# Patient Record
Sex: Male | Born: 1965 | Race: Black or African American | Hispanic: No | Marital: Single | Smoking: Current every day smoker
Health system: Southern US, Community
[De-identification: ages and names within clinical notes are randomized; demographics above are authoritative.]

## PROBLEM LIST (undated history)

## (undated) ENCOUNTER — Emergency Department (HOSPITAL_COMMUNITY): Payer: 59 | Source: Home / Self Care

## (undated) DIAGNOSIS — K759 Inflammatory liver disease, unspecified: Secondary | ICD-10-CM

## (undated) DIAGNOSIS — D649 Anemia, unspecified: Secondary | ICD-10-CM

## (undated) DIAGNOSIS — I10 Essential (primary) hypertension: Secondary | ICD-10-CM

## (undated) HISTORY — PX: OTHER SURGICAL HISTORY: SHX169

## (undated) HISTORY — PX: HERNIA REPAIR: SHX51

## (undated) HISTORY — DX: Anemia, unspecified: D64.9

## (undated) HISTORY — DX: Essential (primary) hypertension: I10

## (undated) HISTORY — DX: Inflammatory liver disease, unspecified: K75.9

## (undated) HISTORY — PX: APPENDECTOMY: SHX54

---

## 1999-01-23 ENCOUNTER — Emergency Department (HOSPITAL_COMMUNITY): Admission: EM | Admit: 1999-01-23 | Discharge: 1999-01-23 | Payer: Self-pay | Admitting: Emergency Medicine

## 2000-08-16 ENCOUNTER — Emergency Department (HOSPITAL_COMMUNITY): Admission: EM | Admit: 2000-08-16 | Discharge: 2000-08-16 | Payer: Self-pay | Admitting: Emergency Medicine

## 2013-04-24 ENCOUNTER — Observation Stay (HOSPITAL_COMMUNITY)
Admission: EM | Admit: 2013-04-24 | Discharge: 2013-04-25 | Disposition: A | Discharge status: Home / Self Care | Payer: Self-pay | Attending: Internal Medicine | Admitting: Internal Medicine

## 2013-04-24 ENCOUNTER — Encounter (HOSPITAL_COMMUNITY): Payer: Self-pay

## 2013-04-24 ENCOUNTER — Emergency Department (HOSPITAL_COMMUNITY): Payer: Self-pay

## 2013-04-24 DIAGNOSIS — R6889 Other general symptoms and signs: Secondary | ICD-10-CM

## 2013-04-24 DIAGNOSIS — R55 Syncope and collapse: Secondary | ICD-10-CM | POA: Insufficient documentation

## 2013-04-24 DIAGNOSIS — R7309 Other abnormal glucose: Secondary | ICD-10-CM | POA: Insufficient documentation

## 2013-04-24 DIAGNOSIS — R739 Hyperglycemia, unspecified: Secondary | ICD-10-CM | POA: Diagnosis present

## 2013-04-24 DIAGNOSIS — X30XXXA Exposure to excessive natural heat, initial encounter: Secondary | ICD-10-CM | POA: Insufficient documentation

## 2013-04-24 DIAGNOSIS — R51 Headache: Secondary | ICD-10-CM | POA: Insufficient documentation

## 2013-04-24 DIAGNOSIS — R0609 Other forms of dyspnea: Secondary | ICD-10-CM | POA: Insufficient documentation

## 2013-04-24 DIAGNOSIS — R0602 Shortness of breath: Secondary | ICD-10-CM | POA: Insufficient documentation

## 2013-04-24 DIAGNOSIS — Z720 Tobacco use: Secondary | ICD-10-CM | POA: Diagnosis present

## 2013-04-24 DIAGNOSIS — F17200 Nicotine dependence, unspecified, uncomplicated: Secondary | ICD-10-CM | POA: Insufficient documentation

## 2013-04-24 DIAGNOSIS — G4733 Obstructive sleep apnea (adult) (pediatric): Secondary | ICD-10-CM | POA: Diagnosis present

## 2013-04-24 DIAGNOSIS — Z6841 Body Mass Index (BMI) 40.0 and over, adult: Secondary | ICD-10-CM | POA: Insufficient documentation

## 2013-04-24 DIAGNOSIS — T678XXA Other effects of heat and light, initial encounter: Secondary | ICD-10-CM | POA: Insufficient documentation

## 2013-04-24 DIAGNOSIS — R0989 Other specified symptoms and signs involving the circulatory and respiratory systems: Secondary | ICD-10-CM | POA: Insufficient documentation

## 2013-04-24 DIAGNOSIS — Z23 Encounter for immunization: Secondary | ICD-10-CM | POA: Insufficient documentation

## 2013-04-24 DIAGNOSIS — R42 Dizziness and giddiness: Secondary | ICD-10-CM | POA: Insufficient documentation

## 2013-04-24 DIAGNOSIS — G4710 Hypersomnia, unspecified: Secondary | ICD-10-CM | POA: Insufficient documentation

## 2013-04-24 DIAGNOSIS — R079 Chest pain, unspecified: Principal | ICD-10-CM | POA: Diagnosis present

## 2013-04-24 DIAGNOSIS — E669 Obesity, unspecified: Secondary | ICD-10-CM | POA: Diagnosis present

## 2013-04-24 DIAGNOSIS — L83 Acanthosis nigricans: Secondary | ICD-10-CM | POA: Insufficient documentation

## 2013-04-24 DIAGNOSIS — R5381 Other malaise: Secondary | ICD-10-CM | POA: Insufficient documentation

## 2013-04-24 LAB — RAPID URINE DRUG SCREEN, HOSP PERFORMED: Benzodiazepines: NOT DETECTED

## 2013-04-24 LAB — COMPREHENSIVE METABOLIC PANEL
Albumin: 3.7 g/dL (ref 3.5–5.2)
BUN: 21 mg/dL (ref 6–23)
Chloride: 101 mEq/L (ref 96–112)
Creatinine, Ser: 1.25 mg/dL (ref 0.50–1.35)
GFR calc Af Amer: 77 mL/min — ABNORMAL LOW (ref >90)
Glucose, Bld: 127 mg/dL — ABNORMAL HIGH (ref 70–99)
Total Bilirubin: 0.3 mg/dL (ref 0.3–1.2)
Total Protein: 7.5 g/dL (ref 6.0–8.3)

## 2013-04-24 LAB — TSH: TSH: 0.875 u[IU]/mL (ref 0.350–4.500)

## 2013-04-24 LAB — CBC WITH DIFFERENTIAL/PLATELET
Basophils Relative: 1 % (ref 0–1)
Eosinophils Absolute: 0.1 10*3/uL (ref 0.0–0.7)
HCT: 39.2 % (ref 39.0–52.0)
Hemoglobin: 13.4 g/dL (ref 13.0–17.0)
MCH: 29.8 pg (ref 26.0–34.0)
MCHC: 34.2 g/dL (ref 30.0–36.0)
Monocytes Absolute: 0.4 10*3/uL (ref 0.1–1.0)
Monocytes Relative: 7 % (ref 3–12)

## 2013-04-24 LAB — TROPONIN I
Troponin I: <0.3 ng/mL (ref <0.30)
Troponin I: <0.3 ng/mL (ref <0.30)

## 2013-04-24 LAB — HEMOGLOBIN A1C
Hgb A1c MFr Bld: 5.8 % — ABNORMAL HIGH (ref <5.7)
Mean Plasma Glucose: 120 mg/dL — ABNORMAL HIGH (ref <117)

## 2013-04-24 LAB — CBC
HCT: 40.4 % (ref 39.0–52.0)
Hemoglobin: 13.5 g/dL (ref 13.0–17.0)
MCV: 88 fL (ref 78.0–100.0)
Platelets: 316 10*3/uL (ref 150–400)
RBC: 4.59 MIL/uL (ref 4.22–5.81)
WBC: 6.5 10*3/uL (ref 4.0–10.5)

## 2013-04-24 LAB — POCT I-STAT TROPONIN I: Troponin i, poc: 0.01 ng/mL (ref 0.00–0.08)

## 2013-04-24 LAB — CREATININE, SERUM: GFR calc Af Amer: 69 mL/min — ABNORMAL LOW (ref >90)

## 2013-04-24 MED ORDER — PANTOPRAZOLE SODIUM 40 MG PO TBEC
40.0000 mg | DELAYED_RELEASE_TABLET | Freq: Every day | ORAL | Status: DC
  Administered 2013-04-25: 40 mg via ORAL
  Filled 2013-04-24 (×3): qty 1

## 2013-04-24 MED ORDER — ACETAMINOPHEN 650 MG RE SUPP: 650.0000 mg | Freq: Four times a day (QID) | RECTAL | Status: DC | PRN

## 2013-04-24 MED ORDER — ASPIRIN 81 MG PO CHEW
324.0000 mg | CHEWABLE_TABLET | Freq: Once | ORAL | Status: AC
  Administered 2013-04-24: 324 mg via ORAL
  Filled 2013-04-24: qty 4

## 2013-04-24 MED ORDER — SODIUM CHLORIDE 0.9 % IV BOLUS (SEPSIS)
1000.0000 mL | Freq: Once | INTRAVENOUS | Status: AC
  Administered 2013-04-24: 1000 mL via INTRAVENOUS

## 2013-04-24 MED ORDER — SODIUM CHLORIDE 0.9 % IJ SOLN
3.0000 mL | Freq: Two times a day (BID) | INTRAMUSCULAR | Status: DC
  Administered 2013-04-24 – 2013-04-25 (×2): 3 mL via INTRAVENOUS

## 2013-04-24 MED ORDER — ACETAMINOPHEN 325 MG PO TABS
650.0000 mg | ORAL_TABLET | Freq: Four times a day (QID) | ORAL | Status: DC | PRN
  Administered 2013-04-25: 650 mg via ORAL
  Filled 2013-04-24: qty 2

## 2013-04-24 MED ORDER — MORPHINE SULFATE 4 MG/ML IJ SOLN
4.0000 mg | Freq: Once | INTRAMUSCULAR | Status: AC
  Administered 2013-04-24: 4 mg via INTRAVENOUS
  Filled 2013-04-24: qty 1

## 2013-04-24 MED ORDER — ENOXAPARIN SODIUM 40 MG/0.4ML ~~LOC~~ SOLN
40.0000 mg | SUBCUTANEOUS | Status: DC
  Administered 2013-04-24: 40 mg via SUBCUTANEOUS
  Filled 2013-04-24 (×2): qty 0.4

## 2013-04-24 MED ORDER — OXYCODONE HCL 5 MG PO TABS
5.0000 mg | ORAL_TABLET | ORAL | Status: DC | PRN
  Administered 2013-04-24: 5 mg via ORAL
  Filled 2013-04-24: qty 1

## 2013-04-24 MED ORDER — ASPIRIN 81 MG PO CHEW
81.0000 mg | CHEWABLE_TABLET | Freq: Every day | ORAL | Status: DC
  Administered 2013-04-25: 12:00:00 81 mg via ORAL
  Filled 2013-04-24: qty 1

## 2013-04-24 MED ORDER — SODIUM CHLORIDE 0.9 % IJ SOLN: 3.0000 mL | INTRAMUSCULAR | Status: DC | PRN

## 2013-04-24 MED ORDER — PNEUMOCOCCAL VAC POLYVALENT 25 MCG/0.5ML IJ INJ
0.5000 mL | INJECTION | INTRAMUSCULAR | Status: AC
  Administered 2013-04-25: 0.5 mL via INTRAMUSCULAR
  Filled 2013-04-24 (×2): qty 0.5

## 2013-04-24 MED ORDER — ONDANSETRON HCL 4 MG/2ML IJ SOLN: 4.0000 mg | Freq: Four times a day (QID) | INTRAMUSCULAR | Status: DC | PRN

## 2013-04-24 MED ORDER — ONDANSETRON HCL 4 MG PO TABS: 4.0000 mg | ORAL_TABLET | Freq: Four times a day (QID) | ORAL | Status: DC | PRN

## 2013-04-24 MED ORDER — SODIUM CHLORIDE 0.9 % IV SOLN: 250.0000 mL | INTRAVENOUS | Status: DC | PRN

## 2013-04-24 MED ORDER — SODIUM CHLORIDE 0.9 % IJ SOLN
3.0000 mL | Freq: Two times a day (BID) | INTRAMUSCULAR | Status: DC
  Administered 2013-04-24: 3 mL via INTRAVENOUS

## 2013-04-24 MED ORDER — MORPHINE SULFATE 4 MG/ML IJ SOLN
4.0000 mg | INTRAMUSCULAR | Status: DC | PRN
  Administered 2013-04-24: 4 mg via INTRAVENOUS
  Filled 2013-04-24: qty 1

## 2013-04-24 MED ORDER — INFLUENZA VAC SPLIT QUAD 0.5 ML IM SUSP
0.5000 mL | INTRAMUSCULAR | Status: AC
  Administered 2013-04-25: 0.5 mL via INTRAMUSCULAR
  Filled 2013-04-24 (×2): qty 0.5

## 2013-04-24 MED ORDER — ALUM & MAG HYDROXIDE-SIMETH 200-200-20 MG/5ML PO SUSP: 30.0000 mL | Freq: Four times a day (QID) | ORAL | Status: DC | PRN

## 2013-04-24 NOTE — ED Notes (Signed)
Pt began having symptoms 2 days ago. He thought his chest pain felt like heart burn then he began to feel dizzy and light headed. Pt began to feel shaky and have near syncopal episodes. Pt chest began to feel tight and has a severe head ache as well. Pt states his right leg feels hot.

## 2013-04-24 NOTE — ED Provider Notes (Signed)
TIME SEEN: 10:57 AM  CHIEF COMPLAINT: Chest pain  HPI: Patient is a 47 year old male with a history of tobacco use who presents the emergency department with intermittent chest pressure with shortness of breath, diaphoresis for the last several days. He reports his pain is worse with exertion. Over the past 2 days he has been very lightheaded and feels like he may pass out at any moment. He describes a near-syncopal event on the way here to the emergency department had to pull his car over and call EMS. Denies any nausea or vomiting. No history of PE or DVT. No lower extremity swelling, prolonged immobilization, recent fracture, surgery, trauma. Patient is also complaining of a headache behind his right eye that feels a sharp, stabbing pain. He has had similar headaches in the past for several years. No photophobia or phonophobia. No fever, neck pain or neck stiffness. No numbness, tingling or focal weakness. No thunderclap headache.  ROS: See HPI Constitutional: no fever  Eyes: no drainage  ENT: no runny nose   Cardiovascular:   chest pain  Resp:  SOB  GI: no vomiting GU: no dysuria Integumentary: no rash  Allergy: no hives  Musculoskeletal: no leg swelling  Neurological: no slurred speech ROS otherwise negative  PAST MEDICAL HISTORY/PAST SURGICAL HISTORY:  No past medical history on file.  MEDICATIONS:  Prior to Admission medications   Medication Sig Start Date End Date Taking? Authorizing Provider  Ibuprofen-Diphenhydramine HCl (ADVIL PM) 200-25 MG CAPS Take 2 capsules by mouth at bedtime as needed (headache).   Yes Historical Provider, MD    ALLERGIES:  No Known Allergies  SOCIAL HISTORY:  History  Substance Use Topics  . Smoking status: Not on file  . Smokeless tobacco: Not on file  . Alcohol Use: Not on file    FAMILY HISTORY: Mother with a history of CHF  EXAM: BP 161/85  Pulse 83  Temp(Src) 97.5 F (36.4 C) (Oral)  Resp 25  SpO2 98% CONSTITUTIONAL: Alert and  oriented and responds appropriately to questions. Well-appearing; well-nourished HEAD: Normocephalic EYES: Conjunctivae clear, PERRL ENT: normal nose; no rhinorrhea; moist mucous membranes; pharynx without lesions noted NECK: Supple, no meningismus, no LAD  CARD: RRR; S1 and S2 appreciated; no murmurs, no clicks, no rubs, no gallops RESP: Normal chest excursion without splinting or tachypnea; breath sounds clear and equal bilaterally; no wheezes, no rhonchi, no rales,  ABD/GI: Normal bowel sounds; non-distended; soft, non-tender, no rebound, no guarding BACK:  The back appears normal and is non-tender to palpation, there is no CVA tenderness EXT: Normal ROM in all joints; non-tender to palpation; no edema; normal capillary refill; no cyanosis    SKIN: Normal color for age and race; warm NEURO: Moves all extremities equally, no facial droop, no slurred speech, normal sensation PSYCH: The patient's mood and manner are appropriate. Grooming and personal hygiene are appropriate.  MEDICAL DECISION MAKING:  Patient with some risk factors for ACS with chest pain and shortness of breath that is worse with exertion. He also describes a near syncopal event today with this episode. He does not have a primary care physician or cardiologist. No prior history of stress test or cardiac catheterization. He is hemodynamically stable. Will give aspirin, obtain cardiac labs, chest x-ray. Have recommended admission for chest pain rule out and near syncopal workup.   Date: 04/24/2013 10:41 AM  Rate: 82  Rhythm: normal sinus rhythm  QRS Axis: normal  Intervals: normal  ST/T Wave abnormalities: normal  Conduction Disutrbances: none  Narrative Interpretation: T wave flattening in 1 and aVL; no old for comparison    ED PROGRESS:  Labs unremarkable. Troponin negative. Chest x-ray clear. Patient is not orthostatic. Will discuss with hospitalist for admission. Patient agrees with this plan.  Spoke with Dr. Darnelle Catalan for  admission for chest pain rule out and near syncopal workup.  Layla Maw Wallis Vancott, DO 04/24/13 1256

## 2013-04-24 NOTE — Progress Notes (Signed)
Utilization Review completed.  Iliyana Convey RN CM  

## 2013-04-24 NOTE — H&P (Signed)
[facility] History and Physical  Kyle Lucas ZOX:[id number] DOB: 1965-08-22 DOA: 04/24/2013  Referring physician: Rochele Raring, DO PCP: No primary provider on file.   Chief Complaint: Chest pain   History of Present Illness: Kyle Lucas is an 47 y.o. male with no significant PMH who presents with a chief complaint of chest pain and presyncope. Patient tells me that he has been feeling dizzy for about 2 weeks. The dizziness is associated with headache and feeling weak. He also reports 3 days of intermittent chest pain. He has had chest pain similar to what he is experiencing now, and in the past, he took Prilosec and it relieved the pain. The pain has been intermittent, rated 4/10 at its worst, lasting approximately 15-20 minutes at a time. Prilosec is not working to alleviate the pain, as he usually does. 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 He called a coworker to come take him home. The symptoms were not associated with any diaphoresis. Patient has had some shortness of breath but no cough. He tells me that he is "afraid to sleep "because he is afraid that he will stop breathing and then he wakes frequently in the night feeling like he has smothered. He has been fatigued and has had dyspnea on exertion over the past several months. His cardiac risk factors include male sex, family history of early cardiac death, and smoking history. His lipid status is not known.  Review of Systems: Constitutional: No fever, no chills;  Appetite normal; No weight loss, no weight gain, + fatigue.  HEENT: No blurry vision, no diplopia, no pharyngitis, no dysphagia CV: + pressure like chest pain, no palpitations.  Resp: + intermittent/progressive SOB, no cough, +PND, +OSA symptoms. GI: No nausea, no vomiting, no diarrhea, no melena, no hematochezia.  GU: No dysuria, no  hematuria. MSK: no myalgias, + knee arthralgias.  Neuro:  + headache, no focal neurological deficits, no history of seizures.  Psych: + depression, + anxiety.  Endo: + heat intolerance, no cold intolerance, no polyuria, no polydipsia  Skin: No rashes, no skin lesions.  Heme: No easy bruising.  Past Medical History No past medical history on file.   Past Surgical History Past Surgical History  Procedure Laterality Date  . Left  arm surgery      knife injury.   Marland Kitchen Appendectomy    . Hernia repair       Social History: History   Social History  . Marital Status: Single    Spouse Name: N/A    Number of Children: 3  . Years of Education: 12   Occupational History  . Cab driver    Social History Main Topics  . Smoking status: Current Every Day Smoker -- 0.50 packs/day for 20 years    Types: Cigarettes  . Smokeless tobacco: Not on file  . Alcohol Use: Yes     Comment: 1-2 drinks once a week.  . Drug Use: No  . Sexual Activity: Not on file   Other Topics Concern  . Not on file   Social History Narrative   Single.  Lives alone.      Family History:  Family History  Problem Relation Age of Onset  . Cancer Father   . Heart failure Mother     Allergies: Review of patient's allergies indicates no known allergies.  Meds:  Prior to Admission medications   Medication Sig Start Date End Date Taking? Authorizing Provider  Ibuprofen-Diphenhydramine HCl (ADVIL PM) 200-25 MG CAPS Take 2 capsules by mouth at bedtime as needed (headache).   Yes Historical Provider, MD    Physical Exam: Filed Vitals:   04/24/13 1139 04/24/13 1141 04/24/13 1240 04/24/13 1300  BP: 158/79  117/62 117/69  Pulse: 85  76 67  Temp:  97.4 F (36.3 C)    TempSrc:  Oral    Resp:  20 22 21   SpO2:  97% 94% 95%     Physical Exam: Blood pressure 117/69, pulse 67, temperature 97.4 F (36.3 C), temperature source Oral, resp. rate 21, SpO2 95.00%. Gen: No acute distress. Obese. Head: Normocephalic,  atraumatic. Eyes: PERRL, EOMI, sclerae nonicteric. Mouth: Oropharynx Lucas. Mucous membranes moist. Neck: Supple, no thyromegaly, no lymphadenopathy, no jugular venous distention. Chest: Lungs Lucas to auscultation bilaterally. CV: Heart sounds are regular. No murmurs, rubs, or gallops. Abdomen: Soft, nontender, nondistended with normal active bowel sounds. Extremities: Extremities are with trace edema. Skin: Warm and dry. Acanthosis nigricans noted to the neck area. Neuro: Alert and oriented times 3; cranial nerves II through XII grossly intact. Psych: Mood and affect anxious.  Labs on Admission:  Basic Metabolic Panel:  Recent Labs Lab 04/24/13 1100  NA 135  K 3.8  CL 101  CO2 25  GLUCOSE 127*  BUN 21  CREATININE 1.25  CALCIUM 9.4   Liver Function Tests:  Recent Labs Lab 04/24/13 1100  AST 24  ALT 17  ALKPHOS 63  BILITOT 0.3  PROT 7.5  ALBUMIN 3.7   CBC:  Recent Labs Lab 04/24/13 1100  WBC 6.1  NEUTROABS 4.1  HGB 13.4  HCT 39.2  MCV 87.1  PLT 346   Radiological Exams on Admission: Dg Chest 2 View  04/24/2013   *RADIOLOGY REPORT*  Clinical Data: Chest pain  CHEST - 2 VIEW  Comparison: None.  Findings: Lungs are Lucas.  Heart is upper normal in size with normal pulmonary vascularity.  No pneumothorax.  No adenopathy.  No bone lesions.  IMPRESSION: No edema or consolidation.   Original Report Authenticated By: Bretta Bang, M.D.    EKG: Independently reviewed. In NSR at 82 beats per minute. T wave flattening in 1 and aVL. No old EKGs for comparison.  Assessment/Plan Principal Problem:   Chest pain -Admit to telemetry. Cycle cardiac markers q. 6 hours x3 sets. -Given family history of early cardiac death from heart failure, will check two-dimensional echocardiogram. -Start aspirin therapy. -Further risk stratify with a fasting lipid panel. -Check urine drug screen. -Start on empiric Protonix given heartburn history. Active Problems:    Obesity -Dietitian consultation requested for weight loss.   Tobacco abuse -Tobacco cessation counseling per nursing staff.   Probable Obstructive sleep apnea with fatigue -Will need outpatient sleep study set up at discharge. -Will check two-dimensional echocardiogram to evaluate for pulmonary hypertension.   Heat intolerance -Check TSH.   Hyperglycemia -Check hemoglobin A1c.  Code Status: Full. Family Communication: No family at bedside. Disposition Plan: Home when stable.  Time spent: One hour.  Kyle Lucas [facility] Pager [phone]  If 7PM-7AM, please contact night-coverage [email or web address] Password [facility] 04/24/2013, 2:04 PM

## 2013-04-24 NOTE — ED Notes (Signed)
Attempted to give report to floor RN. Nurse is unavailable will call back.

## 2013-04-24 NOTE — ED Notes (Signed)
Report given to floor RN

## 2013-04-24 NOTE — Progress Notes (Signed)
[facility] provided pt with a list of primary care resources and a [facility] [employer] [facility] application.  °

## 2013-04-25 DIAGNOSIS — I517 Cardiomegaly: Secondary | ICD-10-CM

## 2013-04-25 LAB — LIPID PANEL
Cholesterol: 141 mg/dL (ref 0–200)
VLDL: 23 mg/dL (ref 0–40)

## 2013-04-25 NOTE — Discharge Summary (Signed)
Physician Discharge Summary  Qunicy Higinbotham AOZ:[id number] DOB: 18-Apr-1966 DOA: 04/24/2013  PCP: No primary provider on file.  Admit date: 04/24/2013 Discharge date: 04/25/2013  Time spent: 45 minutes  Recommendations for Outpatient Follow-up:  -Will be discharged home today. -Appointments have been made for him to follow up with a PCP and a sleep study.   Discharge Diagnoses:  Principal Problem:   Chest pain Active Problems:   Obesity   Tobacco abuse   Probable Obstructive sleep apnea with fatigue   Heat intolerance   Hyperglycemia   Discharge Condition: Stable and improved.  Filed Weights   04/24/13 1428  Weight: 117.8 kg (259 lb 11.2 oz)    History of present illness:  Kyle Lucas is an 47 y.o. male with no significant PMH who presents with a chief complaint of chest pain and presyncope. Patient tells me that he has been feeling dizzy for about 2 weeks. The dizziness is associated with headache and feeling weak. He also reports 3 days of intermittent chest pain. He has had chest pain similar to what he is experiencing now, and in the past, he took Prilosec and it relieved the pain. The pain has been intermittent, rated 4/10 at its worst, lasting approximately 15-20 minutes at a time. Prilosec is not working to alleviate the pain, as he usually does. 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 He called a coworker to come take him home. The symptoms were not associated with any diaphoresis. Patient has had some shortness of breath but no cough. He tells me that he is "afraid to sleep "because he is afraid that he will stop breathing and then he wakes frequently in the night feeling like he has smothered. He has been fatigued and has had dyspnea on exertion over the past several months. His cardiac risk factors include male sex, family history of early cardiac  death, and smoking history. His lipid status is not known. We were asked to admit him for further evaluation and management.   Hospital Course:  Chest Pain -Resolved. -Ruled out for ACS by negative troponins and EKG without acute ischemic abnormality. -ECHO:Study Conclusions  Left ventricle: The cavity size was normal. Wall thickness was increased in a pattern of mild LVH. Systolic function was normal. The estimated ejection fraction was in the range of 55% to 60%.  ?OSA -Believe this is his main issue. -Have made arrangements for an OP sleep study. -Appointment details in discharge instructions.  Rest of chronic medical conditions have been stable.     Procedures:  ECHO   Consultations:  None  Discharge Instructions  Discharge Orders   Future Appointments Provider Department Dept Phone   05/13/2013 2:00 PM Cleora Fleet, MD [facility] AND [facility] [phone]   06/01/2013 8:00 PM [facility] [phone]   Future Orders Complete By Expires   Discontinue IV  As directed    Increase activity slowly  As directed        Medication List         ADVIL PM 200-25 MG Caps  Generic drug:  Ibuprofen-Diphenhydramine HCl  Take 2 capsules by mouth at bedtime as needed (headache).       No Known Allergies     Follow-up Information   Follow up with Standley Dakins, MD. (  Appointment 05/13/13 at 2:00 PM.  Take pictured ID, Medications, and $20.00 co pay. )    Specialty:  Family Medicine   Contact information:   [address] [place] [address] [phone]        The results of significant diagnostics from this hospitalization (including imaging, microbiology, ancillary and laboratory) are listed below for reference.    Significant Diagnostic Studies: Dg Chest 2 View  04/24/2013   *RADIOLOGY REPORT*  Clinical Data: Chest pain  CHEST - 2 VIEW  Comparison: None.  Findings: Lungs are  clear.  Heart is upper normal in size with normal pulmonary vascularity.  No pneumothorax.  No adenopathy.  No bone lesions.  IMPRESSION: No edema or consolidation.   Original Report Authenticated By: Bretta Bang, M.D.    Microbiology: No results found for this or any previous visit (from the past 240 hour(s)).   Labs: Basic Metabolic Panel:  Recent Labs Lab 04/24/13 1100 04/24/13 1451  NA 135  --   K 3.8  --   CL 101  --   CO2 25  --   GLUCOSE 127*  --   BUN 21  --   CREATININE 1.25 1.37*  CALCIUM 9.4  --    Liver Function Tests:  Recent Labs Lab 04/24/13 1100  AST 24  ALT 17  ALKPHOS 63  BILITOT 0.3  PROT 7.5  ALBUMIN 3.7   No results found for this basename: LIPASE, AMYLASE,  in the last 168 hours No results found for this basename: AMMONIA,  in the last 168 hours CBC:  Recent Labs Lab 04/24/13 1100 04/24/13 1451  WBC 6.1 6.5  NEUTROABS 4.1  --   HGB 13.4 13.5  HCT 39.2 40.4  MCV 87.1 88.0  PLT 346 316   Cardiac Enzymes:  Recent Labs Lab 04/24/13 1451 04/24/13 2014 04/25/13 0220  TROPONINI <0.30 <0.30 <0.30   BNP: BNP (last 3 results)  Recent Labs  04/24/13 1410  PROBNP <5.0   CBG: No results found for this basename: GLUCAP,  in the last 168 hours     Signed:  Chaya Jan  [facility] Pager: [phone] 04/25/2013, 4:08 PM

## 2013-04-25 NOTE — Progress Notes (Signed)
CARE MANAGEMENT NOTE 04/25/2013  Patient:  Kyle Lucas, Kyle Lucas   Account Number:  [id number]  Date Initiated:  04/25/2013  Documentation initiated by:  Ezekiel Ina  Subjective/Objective Assessment:   pt admitted with cco chest pain     Action/Plan:   from home   Anticipated DC Date:  04/25/2013   Anticipated DC Plan:  HOME/SELF CARE      DC Planning Services  CM consult  Follow-up appt scheduled      Choice offered to / List presented to:             Status of service:  In process, will continue to follow Medicare Important Message given?  NA - LOS <3 / Initial given by admissions (If response is "NO", the following Medicare IM given date [place] will be blank) Date Medicare IM given:   Date Additional Medicare IM given:    Discharge Disposition:    Per UR Regulation:  Reviewed for med. necessity/level of care/duration of stay  If discussed at Long Length of Stay Meetings, dates discussed:    Comments:  04/25/13 MMcGibboney, RN, BSN Chart reviewed. Appt with [facility] for 9/30 at 2:00 PM made, Sleep Study appt. 10/19 at 8:00PM scheduled.  Pt is aware of these appointments.

## 2013-04-25 NOTE — Progress Notes (Signed)
  RD consulted for nutrition education regarding weight loss.  Body mass index is 41.94 kg/(m^2). Pt meets criteria for Morbid Obesity based on current BMI. Pt's current weight is 259 lbs; pt states he used to weigh 210 lbs and has gained weight due to decreased physical activity and increased intake of fast food.   RD provided "Weight Loss Tips" handout from the Academy of [employer] and Dietetics. Pt reports eating a lot of fast food, pizza, ice cream, and cereal and drinking soda and sweet tea all day long. Encouraged increased intake of fruits and vegetables; discussed ways to incorporate fruits and vegetables into pt's meals and snacks. Provided examples of ways to balance meals/snacks and encouraged intake of high-fiber, whole grain complex carbohydrates. Emphasized the importance of hydration with calorie-free beverages and limiting sugar-sweetened beverages. Reviewed high fat foods for pt to avoid and healthier alternative cooking methods to frying. Provide sample low-calorie healthy menus. Encouraged pt to discuss physical activity options with physician. Teach back method used.  Pt states he will drink water instead of soda/tea, switch to low fat milk, decrease his portion sizes when eating out, cook at home more, eat more fruit, and he will try to be more physically active.   Expect good compliance.  Current diet order is Regular, patient is consuming approximately 100% of meals at this time. Labs and medications reviewed. No further nutrition interventions warranted at this time. RD contact information provided. If additional nutrition issues arise, please re-consult RD.  Ian Malkin RD, LDN Inpatient Clinical Dietitian Pager: [phone] After Hours Pager: [phone]

## 2013-04-25 NOTE — Progress Notes (Signed)
*  PRELIMINARY RESULTS* Echocardiogram 2D Echocardiogram has been performed.  Kyle Lucas 04/25/2013, 1:21 PM 

## 2013-05-13 ENCOUNTER — Inpatient Hospital Stay: Payer: Self-pay | Admitting: Family Medicine

## 2013-06-01 ENCOUNTER — Ambulatory Visit (HOSPITAL_BASED_OUTPATIENT_CLINIC_OR_DEPARTMENT_OTHER): Payer: 59 | Attending: Internal Medicine

## 2013-06-01 VITALS — Ht 66.0 in | Wt 250.0 lb

## 2013-06-01 DIAGNOSIS — G4733 Obstructive sleep apnea (adult) (pediatric): Secondary | ICD-10-CM

## 2013-06-07 NOTE — Procedures (Signed)
NAME:  Kyle Lucas, Kyle Lucas                ACCOUNT NO.:  [id number]  MEDICAL RECORD NO.:  [id number]          PATIENT TYPE:  OUT  LOCATION:  SLEEP CENTER                 FACILITY:  [facility]  PHYSICIAN:  Falon Flinchum D. Maple Hudson, MD, FCCP, FACPDATE OF BIRTH:  09[birth year]  DATE OF STUDY:  06/01/2013                           NOCTURNAL POLYSOMNOGRAM  REFERRING PHYSICIAN:  Peggye Pitt, M.D.  INDICATION FOR STUDY:  Hypersomnia with sleep apnea.  EPWORTH SLEEPINESS SCORE:  6/24.  BMI 38.7.  Weight 240 pounds.  Height 66 inches, neck 18 inches.  MEDICATIONS:  Home medications are charted for review.  SLEEP ARCHITECTURE:  Total sleep time 337.5 minutes with sleep efficiency 93%.  Stage I was 9%, stage II 74.8%, stage III absent,  REM 16.1% of total sleep time.  Sleep latency 7 minutes, REM latency 100.5 minutes.  Awake after sleep onset 18 minutes, arousal index 5.3.  Bedtime Medication:  None.  RESPIRATORY DATA:  Apnea-hypopnea index (AHI) 16 per hour.  A total of 90 events was scored including 22 obstructive apneas and 68 hypopneas. Events were more common while supine.  REM AHI 34.1 per hour.  There were not enough early events to meet protocol criteria for split protocol CPAP titration.  OXYGEN DATA:  Moderately loud snoring with oxygen desaturation to a nadir of 74% and mean oxygen saturation through the study of 94% on room air.  CARDIAC DATA:  Normal sinus rhythm.  MOVEMENT-PARASOMNIA:  No significant movement disturbed.  No bathroom trips.  IMPRESSIONS-RECOMMENDATIONS: 1. Moderate obstructive sleep apnea/hypopnea syndrome, AHI 16 per hour     with events in all positions.  REM AHI 34.1 per hour.  Moderately     loud snoring with oxygen desaturation to a nadir of 74% and mean     oxygen saturation through the study of 94% on room air. 2. There were insufficient numbers of early events to permit     application of split protocol CPAP titration.  Consider return for     dedicated CPAP  titration study if appropriate.     [name], MD, Tonny Bollman, FACP Diplomate, American Board of Sleep Medicine    CDY/MEDQ  D:  06/07/2013 09:19:56  T:  06/07/2013 09:47:19  Job:  [id number]

## 2013-07-03 ENCOUNTER — Encounter: Payer: Self-pay | Admitting: Internal Medicine

## 2013-09-10 ENCOUNTER — Encounter (HOSPITAL_COMMUNITY): Payer: Self-pay | Admitting: Emergency Medicine

## 2013-09-10 ENCOUNTER — Emergency Department (HOSPITAL_COMMUNITY)
Admission: EM | Admit: 2013-09-10 | Discharge: 2013-09-10 | Disposition: A | Discharge status: Home / Self Care | Payer: 59 | Attending: Emergency Medicine | Admitting: Emergency Medicine

## 2013-09-10 DIAGNOSIS — R51 Headache: Secondary | ICD-10-CM | POA: Insufficient documentation

## 2013-09-10 DIAGNOSIS — H9209 Otalgia, unspecified ear: Secondary | ICD-10-CM | POA: Insufficient documentation

## 2013-09-10 DIAGNOSIS — F17200 Nicotine dependence, unspecified, uncomplicated: Secondary | ICD-10-CM | POA: Insufficient documentation

## 2013-09-10 DIAGNOSIS — H938X1 Other specified disorders of right ear: Secondary | ICD-10-CM

## 2013-09-10 DIAGNOSIS — J3489 Other specified disorders of nose and nasal sinuses: Secondary | ICD-10-CM | POA: Insufficient documentation

## 2013-09-10 MED ORDER — PSEUDOEPHEDRINE HCL 60 MG PO TABS: 60.0000 mg | ORAL_TABLET | Freq: Four times a day (QID) | ORAL | Status: DC | PRN

## 2013-09-10 MED ORDER — SALINE SPRAY 0.65 % NA SOLN: 1.0000 | NASAL | Status: DC | PRN

## 2013-09-10 NOTE — ED Provider Notes (Signed)
CSN: [id number]     Arrival date & time 09/10/13  1355 History  This chart was scribed for non-physician practitioner Noland Fordyce, PA-C working with Threasa Beards, MD by Ludger Nutting, ED Scribe. This patient was seen in room WTR5/WTR5 and the patient's care was started at 2:23 PM.    Chief Complaint  Patient presents with  . Ear Fullness    The history is provided by the patient. No language interpreter was used.    HPI Comments: Kyle Lucas is a 48 y.o. male who presents to the Emergency Department complaining of constant right ear fullness that began about 45 minutes ago. Pt reports yawning when he suddenly felt a pop to the right ear. He reports having URI symptoms a few days ago. He states he does have mild dull ache in right ear that is starting to cause a headache, rates 2/10 on pain scale. Has not tried any medication PTA. Denies sick contacts.   History reviewed. No pertinent past medical history. Past Surgical History  Procedure Laterality Date  . Left  arm surgery      knife injury.   Marland Kitchen Appendectomy    . Hernia repair     Family History  Problem Relation Age of Onset  . Cancer Father   . Heart failure Mother    History  Substance Use Topics  . Smoking status: Current Every Day Smoker -- 0.50 packs/day for 20 years    Types: Cigarettes  . Smokeless tobacco: Never Used  . Alcohol Use: Yes     Comment: 1-2 drinks once a week.    Review of Systems  HENT: Positive for congestion and rhinorrhea. Negative for ear discharge and ear pain.        Right ear fullness  All other systems reviewed and are negative.    Allergies  Review of patient's allergies indicates no known allergies.  Home Medications   Current Outpatient Rx  Name  Route  Sig  Dispense  Refill  . pseudoephedrine (SUDAFED) 60 MG tablet   Oral   Take 1 tablet (60 mg total) by mouth every 6 (six) hours as needed for congestion.   30 tablet   0   . sodium chloride (OCEAN) 0.65 % SOLN nasal  spray   Each Nare   Place 1 spray into both nostrils as needed for congestion.   1 Bottle   0    BP 156/89  Pulse 96  Temp(Src) 98.2 F (36.8 C) (Oral)  SpO2 100% Physical Exam  Nursing note and vitals reviewed. Constitutional: He is oriented to person, place, and time. He appears well-developed and well-nourished.  HENT:  Head: Normocephalic and atraumatic.  Right Ear: Tympanic membrane, external ear and ear canal normal.  Left Ear: Tympanic membrane, external ear and ear canal normal.  Nose: Mucosal edema present.  R TM: normal, no edema, erythema or perforation. Ear canal clear, no drainage, erythema, cerumen, or foreign body.   Cardiovascular: Normal rate.   Pulmonary/Chest: Effort normal. No respiratory distress.  Abdominal: He exhibits no distension.  Neurological: He is alert and oriented to person, place, and time.  Skin: Skin is warm and dry.  Psychiatric: He has a normal mood and affect.    ED Course  Procedures (including critical care time)  DIAGNOSTIC STUDIES: Oxygen Saturation is 100% on RA, normal by my interpretation.    COORDINATION OF CARE: 2:25 PM Discussed treatment plan with pt at bedside and pt agreed to plan.   Labs Review  Labs Reviewed - No data to display Imaging Review No results found.  EKG Interpretation   None       MDM   1. Sensation of fullness in right ear    Pt c/o fullness in right ear, hx of URI symptoms.  Exam: unremarkable. Normal TM, ear canal clear. Nasal mucosa-edematous.  Rx: sudafed and ocean nasal spray. Advised to f/u with wellness center. Return precautions provided. Pt verbalized understanding and agreement with tx plan.  I personally performed the services described in this documentation, which was scribed in my presence. The recorded information has been reviewed and is accurate.   Noland Fordyce, PA-C 09/10/13 1439

## 2013-09-10 NOTE — Progress Notes (Signed)
[facility] CL provided patient with a list of primary care resources and ACA information.

## 2013-09-10 NOTE — ED Notes (Addendum)
Pt states he yawned 30 minutes ago, which made his right ear "pop". Pt states his hearing is muffled now and that there is drainage sensation in his ear. Pt denies ringing in ears.

## 2013-09-10 NOTE — ED Provider Notes (Signed)
Medical screening examination/treatment/procedure(s) were performed by non-physician practitioner and as supervising physician I was immediately available for consultation/collaboration.  EKG Interpretation   None        Threasa Beards, MD 09/10/13 1444

## 2013-09-10 NOTE — Discharge Instructions (Signed)
On exam no evidence of ear infection, ear wax build up or damage to ear drum.  Fullness sensation in ear may be due to nasal congestion and recent cold. You may try decongestants as well as nasal spray to help clear sinuses and allow them to drain easier. Do NOT use Q-tip or similar objects in ear as this could cause damage to eardrum if inserted too far.  If decongestants and nasal sprays do not help relieve symptoms in 2-3 days, follow up with a primary care provider for further evaluation and treatment.

## 2014-02-10 ENCOUNTER — Emergency Department (HOSPITAL_COMMUNITY): Admission: EM | Admit: 2014-02-10 | Payer: 59 | Source: Home / Self Care

## 2014-08-04 ENCOUNTER — Encounter (HOSPITAL_COMMUNITY): Payer: Self-pay

## 2014-08-04 ENCOUNTER — Emergency Department (HOSPITAL_COMMUNITY)
Admission: EM | Admit: 2014-08-04 | Discharge: 2014-08-04 | Disposition: A | Discharge status: Home / Self Care | Payer: 59 | Attending: Emergency Medicine | Admitting: Emergency Medicine

## 2014-08-04 DIAGNOSIS — R110 Nausea: Secondary | ICD-10-CM | POA: Insufficient documentation

## 2014-08-04 DIAGNOSIS — R634 Abnormal weight loss: Secondary | ICD-10-CM | POA: Insufficient documentation

## 2014-08-04 DIAGNOSIS — R6883 Chills (without fever): Secondary | ICD-10-CM | POA: Insufficient documentation

## 2014-08-04 DIAGNOSIS — R1013 Epigastric pain: Secondary | ICD-10-CM | POA: Insufficient documentation

## 2014-08-04 DIAGNOSIS — Z9089 Acquired absence of other organs: Secondary | ICD-10-CM | POA: Insufficient documentation

## 2014-08-04 LAB — URINE MICROSCOPIC-ADD ON

## 2014-08-04 LAB — BASIC METABOLIC PANEL
ANION GAP: 8 (ref 5–15)
BUN: 26 mg/dL — ABNORMAL HIGH (ref 6–23)
CALCIUM: 9.4 mg/dL (ref 8.4–10.5)
CO2: 26 mmol/L (ref 19–32)
CREATININE: 1.51 mg/dL — AB (ref 0.50–1.35)
Chloride: 107 mEq/L (ref 96–112)
GFR calc Af Amer: 61 mL/min — ABNORMAL LOW (ref >90)
GFR, EST NON AFRICAN AMERICAN: 53 mL/min — AB (ref >90)
Glucose, Bld: 81 mg/dL (ref 70–99)
Potassium: 4.4 mmol/L (ref 3.5–5.1)
SODIUM: 141 mmol/L (ref 135–145)

## 2014-08-04 LAB — URINALYSIS, ROUTINE W REFLEX MICROSCOPIC
BILIRUBIN URINE: NEGATIVE
Glucose, UA: NEGATIVE mg/dL
KETONES UR: 40 mg/dL — AB
Leukocytes, UA: NEGATIVE
NITRITE: NEGATIVE
Protein, ur: NEGATIVE mg/dL
SPECIFIC GRAVITY, URINE: 1.034 — AB (ref 1.005–1.030)
UROBILINOGEN UA: 1 mg/dL (ref 0.0–1.0)
pH: 5 (ref 5.0–8.0)

## 2014-08-04 LAB — CBC WITH DIFFERENTIAL/PLATELET
BASOS ABS: 0 10*3/uL (ref 0.0–0.1)
BASOS PCT: 0 % (ref 0–1)
EOS ABS: 0.1 10*3/uL (ref 0.0–0.7)
EOS PCT: 2 % (ref 0–5)
HEMATOCRIT: 38.5 % — AB (ref 39.0–52.0)
Hemoglobin: 12.3 g/dL — ABNORMAL LOW (ref 13.0–17.0)
LYMPHS PCT: 31 % (ref 12–46)
Lymphs Abs: 1.9 10*3/uL (ref 0.7–4.0)
MCH: 29 pg (ref 26.0–34.0)
MCHC: 31.9 g/dL (ref 30.0–36.0)
MCV: 90.8 fL (ref 78.0–100.0)
MONO ABS: 0.4 10*3/uL (ref 0.1–1.0)
Monocytes Relative: 7 % (ref 3–12)
Neutro Abs: 3.6 10*3/uL (ref 1.7–7.7)
Neutrophils Relative %: 60 % (ref 43–77)
PLATELETS: 360 10*3/uL (ref 150–400)
RBC: 4.24 MIL/uL (ref 4.22–5.81)
RDW: 14.3 % (ref 11.5–15.5)
WBC: 6 10*3/uL (ref 4.0–10.5)

## 2014-08-04 LAB — LIPASE, BLOOD: LIPASE: 14 U/L (ref 11–59)

## 2014-08-04 MED ORDER — GI COCKTAIL ~~LOC~~
30.0000 mL | Freq: Once | ORAL | Status: AC
  Administered 2014-08-04: 30 mL via ORAL
  Filled 2014-08-04: qty 30

## 2014-08-04 MED ORDER — OMEPRAZOLE 20 MG PO CPDR: 20.0000 mg | DELAYED_RELEASE_CAPSULE | Freq: Every day | ORAL | Status: DC

## 2014-08-04 MED ORDER — FAMOTIDINE IN NACL 20-0.9 MG/50ML-% IV SOLN
20.0000 mg | Freq: Once | INTRAVENOUS | Status: AC
  Administered 2014-08-04: 20 mg via INTRAVENOUS
  Filled 2014-08-04: qty 50

## 2014-08-04 MED ORDER — ONDANSETRON HCL 4 MG/2ML IJ SOLN
4.0000 mg | INTRAMUSCULAR | Status: AC
  Administered 2014-08-04: 4 mg via INTRAVENOUS
  Filled 2014-08-04: qty 2

## 2014-08-04 MED ORDER — SODIUM CHLORIDE 0.9 % IV BOLUS (SEPSIS)
1000.0000 mL | Freq: Once | INTRAVENOUS | Status: AC
  Administered 2014-08-04: 1000 mL via INTRAVENOUS

## 2014-08-04 NOTE — Discharge Instructions (Signed)
Please follow the directions provided.  Use the resource guide to establish care with a primary care provider for further evaluation of your symptoms.  Take the omeprazole once a day.  If your upper abdominal pain continues you may take it twice a day.  Your labs today were reassuring that you do not have an infection, or diabetes. Your kidney labs were slightly higher than the last measurement; BUN: 26 and Creatinine: 1.51.  Discuss this with your primary care provider. Your primary care doctor can evaluate the cause of the other non-emergent symptoms.  Don't hesitate to return for any new, worsening or concerning symptoms.      SEEK IMMEDIATE MEDICAL CARE IF:  You have pain in your arms, neck, jaw, teeth, or back.  Your pain increases or changes in intensity or duration.  You develop nausea, vomiting, or sweating (diaphoresis).  You develop shortness of breath, or you faint.  Your vomit is green, yellow, black, or looks like coffee grounds or blood.  Your stool is red, bloody, or black. These symptoms could be signs of other problems, such as heart disease, gastric bleeding, or esophageal bleeding.   Emergency Department Resource Guide 1) Find a Doctor and Pay Out of Pocket Although you won't have to find out who is covered by your insurance plan, it is a good idea to ask around and get recommendations. You will then need to call the office and see if the doctor you have chosen will accept you as a new patient and what types of options they offer for patients who are self-pay. Some doctors offer discounts or will set up payment plans for their patients who do not have insurance, but you will need to ask so you aren't surprised when you get to your appointment.  2) Contact Your Local Health Department Not all health departments have doctors that can see patients for sick visits, but many do, so it is worth a call to see if yours does. If you don't know where your local health department is, you can  check in your phone book. The CDC also has a tool to help you locate your state's health department, and many state websites also have listings of all of their local health departments.  3) Find a [facility] If your illness is not likely to be very severe or complicated, you may want to try a walk in clinic. These are popping up all over the country in pharmacies, drugstores, and shopping centers. They're usually staffed by nurse practitioners or physician assistants that have been trained to treat common illnesses and complaints. They're usually fairly quick and inexpensive. However, if you have serious medical issues or chronic medical problems, these are probably not your best option.  No Primary Care Doctor: - Call Health Connect at  [phone] - they can help you locate a primary care doctor that  accepts your insurance, provides certain services, etc. - Physician Referral Service- [phone]  Chronic Pain Problems: Organization         Address  Phone   Notes  [facility]  [phone] Patients need to be referred by their primary care doctor.   Medication Assistance: Organization         Address  Phone   Notes  [facility] Medication [facility] [place] [address] [place], [place] [address] [phone] --Must be a resident of [facility] -- Must have NO insurance coverage whatsoever (no Medicaid/ Medicare, etc.) -- The pt.  MUST have a primary care doctor that directs their care regularly and follows them in the community   MedAssist  [phone]   [employer]  [phone]    Agencies that provide inexpensive medical care: Organization         Address  Phone   Notes  [place]  [phone]   [facility]    [phone]   [facility] [place], [place] [address] [phone]   [place] [address] [place] [phone]    Planned Parenthood    [phone]   [facility]    [phone]   [place] [address], [place] Phone:  [phone], Fax:  [phone] Hours of Operation:  9 am - 6 pm, M-F.  Also accepts Medicaid/Medicare and self-pay.  [facility] for [place], [address], [place] Phone: [phone], Fax: [phone]. Hours of Operation:  8:30 am - 5:30 pm, M-F.  Also accepts Medicaid and self-pay.  [facility] [address], [place] Phone: [phone]   [place], [place], [place] [phone], Ext. [phone] Mondays & Thursdays: 7-9 AM.  First 15 patients are seen on a first come, first serve basis.    [place] Providers:  Organization         Address  Phone   Notes  [facility] [address], [place] [phone] Also accepts self-pay patients.  [facility] [address], [place]  [phone]   [place], [address], [place] [phone]   [facility] [address], [place] [phone]   Lucianne Lei [address], [place]   [phone] Only accepts [place] Access [place] patients after they have their name applied to their card.   Self-Pay (no insurance) in [facility]:  Organization         Address  Phone   Notes  Sickle Cell Patients, [facility] [place] [phone]   [facility] [place] [phone]   [facility] [place]  [place], [place], [place] [phone]   [facility]/Dr. Osei-Bonsu  [address] [place] or [address], [place] [phone] Phone number for both [place] and [place] locations is the  same.  Urgent Medical and [facility] [address], [place] [phone]   [facility] [address] [place] or [address] [phone]   [facility] [address] [place] [phone], phone; [phone], fax Sees patients 1st and 3rd Saturday of every month.  Must not qualify for public or private insurance (i.e. Medicaid, Medicare, [place] Health Choice, Veterans' Benefits)  Household income should be no more than 200% of the poverty level The clinic cannot treat you if you are pregnant or think you are pregnant  Sexually transmitted diseases are not treated at the clinic.    Dental Care: Organization         Address  Phone  Notes  [facility] [address]  Mardene Speak [phone] Accepts children up to age 64 who are enrolled in Medicaid or [place]; pregnant women with a Medicaid card; and children who have applied for Medicaid or [place] Health Choice, but were declined, whose parents can pay a reduced fee at time of service.  [facility]  [address], [place] [phone] Accepts children up to age 85 who are enrolled in [place] or [place]; pregnant women with a Medicaid card; and children who have applied for Medicaid or [place] Health Choice, but were declined, whose parents can pay a reduced fee at time of service.  [facility] Adult Dental Access PROGRAM  [place] [phone] Patients are seen by appointment only. Walk-ins are not accepted. [place] will see patients 74 years of age and older. Monday - Tuesday (8am-5pm) Most Wednesdays (8:30-5pm) $30 per visit, cash only  [facility] Adult [facility] Access PROGRAM  [address], [facility] [phone] Patients are seen by appointment only. Walk-ins are not accepted. [place] will see patients  6 years of age and older. One Wednesday Evening (Monthly: Volunteer Based).  $30 per visit, cash only  [place]  [phone] for adults; Children under age 22, call Graduate Pediatric Dentistry at [phone]. Children aged 67-14, please call [phone] to request a pediatric application.  Dental services are provided in all areas of dental care including fillings, crowns and bridges, complete and partial dentures, implants, gum treatment, root canals, and extractions. Preventive care is also provided. Treatment is provided to both adults and children. Patients are selected via a lottery and there is often a waiting list.   [facility] [address], [place]  [phone] [email or web address]   [facility] [address] [place], [place] [phone], Ext. [phone] Second and Fourth Thursday of each month, opens at 6:30 AM; Clinic ends at 9 AM.  Patients are seen on a first-come first-served basis, and a limited number are seen during each clinic.   [facility]  [address] Hillard Danker [place], [place] [phone]   Eligibility Requirements You must have lived in [place], [place], or [place] counties for at least the last three months.   You cannot be eligible for state or federal sponsored [employer], including [employer], [place], or [employer].   You generally cannot be eligible for healthcare insurance through your employer.    How to apply: Eligibility screenings are held every Tuesday and Wednesday afternoon from 1:00 pm until 4:00 pm. You do not need an appointment for the interview!  [facility] [address] [place], [place]   [place]  [place] Department  [place]  [phone]    Behavioral Health Resources in the Community: Intensive Outpatient  Programs Organization         Address  Phone  Notes  [place] [place]. [address] [place], [place] [phone]   [facility] Outpatient [address] [place], [place]   ADS: Alcohol & Drug S[facility]cs [address] [place], [place]   [place] [address]  [place], [place] or [phone]   Substance Abuse Resources Organization         Address  Phone  Notes  Alcohol and Drug  Services  [place]  [phone]   [facility] [place]   Chinita Pester  [phone]   Residential & Outpatient Substance Abuse Program  [phone]   Psychological Services Organization         Address  Phone  Notes  [place]  [place]  [phone]   [place] [address], [place] or [phone]    Mobile Crisis Teams Organization         Address  Phone  Notes  [facility], Mobile Crisis Care Unit  [phone]   [facility] Psych[facility]  [address] [place], [place]   [place] [address] [place] [phone]    Self-Help/Support Groups Organization         Address  Phone             Notes  [place]. of [place] - variety of support groups  [place] Call for more information  Narcotics Anonymous (NA), [facility] [address], [employer] [place]  2 meetings at this location   Special educational needs teacher         Address  Phone  Notes  [facility]idential Treatment [place],    [place]  [phone]   [facility]  [address] [place] [phone], [place], [place]   [place], [place] [phone] Admissions: 8am-3pm M-F  Incentives Substance [place] [address]    [place], [place]  [phone]   [facility] [address] [place], [place], [place]   [facility] [address],  [place], [place]   [facility] Programs - Intensive Outpatient [address] Kristeen [address], [place], [place]   [facility] [address].,  [place], [place] [phone] or [phone]   [facility] Treatment Services (RTS) [address] [place], [place] Accepts Medicaid  [facility] [place] [address]  [place] [phone] Substance Abuse/Addiction Treatment   [facility] Organization         Address  Phone  Notes  [facility]  [phone]   Domenic Schwab, PhD [address] Arlis Porta [place], [place]   [phone] or [phone]   [place], [place] [phone]   [facility] [address], [place], [place] [phone] Insurance/Medicaid/sponsorship through [facility] and Families [address], [address]                                    [place], [place] [phone] [place] [address], [place] [phone]    Dr. Adele Schilder  [phone]   [facility] Dept. 1) [address], [place] 2) [place] 3)  [address], [place] [phone]  [phone]   [facility] [phone] or [phone] (After Hours)

## 2014-08-04 NOTE — ED Provider Notes (Signed)
CSN: [id number]     Arrival date & time 08/04/14  1925 History   First MD Initiated Contact with Patient 08/04/14 1937     Chief Complaint  Patient presents with  . Abdominal Pain  . Chills   (Consider location/radiation/quality/duration/timing/severity/associated sxs/prior Treatment) HPI  Kyle Lucas is a 48 yo male presenting with report of recurrent abd pain worse 4 days ago.  He reports a history of epigastric pain usually resolved with as needed omeprazole. He states 4 days ago he noticed some chills that resolved that same day.  He also has had some intermittent nausea then also.  He states in the last 3 weeks he has noticed his clothes are looser and his girlfriend reports he appears thinner but has not objectively weighed himself but feels like he has lost weight.  He also reports a decreased appetite in the last 4 days.  He denies any dysuria, vomiting, diarrhea, objective fevers, melena or hematachezia.  History reviewed. No pertinent past medical history. Past Surgical History  Procedure Laterality Date  . Left  arm surgery      knife injury.   Marland Kitchen Appendectomy    . Hernia repair     Family History  Problem Relation Age of Onset  . Cancer Father   . Heart failure Mother    History  Substance Use Topics  . Smoking status: Current Every Day Smoker -- 0.50 packs/day for 20 years    Types: Cigarettes  . Smokeless tobacco: Never Used  . Alcohol Use: Yes     Comment: 1-2 drinks once a week.    Review of Systems  Constitutional: Positive for chills. Negative for fever.  HENT: Negative for sore throat.   Eyes: Negative for visual disturbance.  Respiratory: Negative for cough and shortness of breath.   Cardiovascular: Negative for chest pain and leg swelling.  Gastrointestinal: Positive for nausea and abdominal pain. Negative for vomiting and diarrhea.  Genitourinary: Negative for dysuria.  Musculoskeletal: Negative for myalgias.  Skin: Negative for rash.  Neurological:  Negative for weakness, numbness and headaches.    Allergies  Review of patient's allergies indicates no known allergies.  Home Medications   Prior to Admission medications   Medication Sig Start Date End Date Taking? Authorizing Provider  pseudoephedrine (SUDAFED) 60 MG tablet Take 1 tablet (60 mg total) by mouth every 6 (six) hours as needed for congestion. 09/10/13   Noland Fordyce, PA-C  sodium chloride (OCEAN) 0.65 % SOLN nasal spray Place 1 spray into both nostrils as needed for congestion. 09/10/13   Noland Fordyce, PA-C   BP 136/82 mmHg  Pulse 95  Temp(Src) 97.9 F (36.6 C) (Oral)  Resp 18  SpO2 98% Physical Exam  Constitutional: He appears well-developed and well-nourished. No distress.  HENT:  Head: Normocephalic and atraumatic.  Mouth/Throat: Oropharynx is clear and moist. No oropharyngeal exudate.  Eyes: Conjunctivae are normal.  Neck: Neck supple. No thyromegaly present.  Cardiovascular: Normal rate, regular rhythm and intact distal pulses.   Pulmonary/Chest: Effort normal and breath sounds normal. No respiratory distress. He has no wheezes. He has no rales. He exhibits no tenderness.  Abdominal: Soft. He exhibits no distension and no mass. There is tenderness in the epigastric area. There is no rigidity, no rebound, no guarding, no CVA tenderness, no tenderness at McBurney's point and negative Murphy's sign.    Musculoskeletal: He exhibits no tenderness.  Lymphadenopathy:    He has no cervical adenopathy.  Neurological: He is alert.  Skin: Skin is  warm and dry. No rash noted. He is not diaphoretic.  Psychiatric: He has a normal mood and affect.  Nursing note and vitals reviewed.   ED Course  Procedures (including critical care time) Labs Review Labs Reviewed  CBC WITH DIFFERENTIAL - Abnormal; Notable for the following:    Hemoglobin 12.3 (*)    HCT 38.5 (*)    All other components within normal limits  BASIC METABOLIC PANEL - Abnormal; Notable for the  following:    BUN 26 (*)    Creatinine, Ser 1.51 (*)    GFR calc non Af Amer 53 (*)    GFR calc Af Amer 61 (*)    All other components within normal limits  URINALYSIS, ROUTINE W REFLEX MICROSCOPIC - Abnormal; Notable for the following:    Specific Gravity, Urine 1.034 (*)    Hgb urine dipstick TRACE (*)    Ketones, ur 40 (*)    All other components within normal limits  LIPASE, BLOOD  URINE MICROSCOPIC-ADD ON    Imaging Review No results found.   EKG Interpretation None      MDM   Final diagnoses:  Epigastric pain   48 yo with recurrent epigastric pain and various non-emergent complaints including subjective weight loss. Discussed case with Dr. Vanita Panda.  CBC, CMP, UA to evaluate for infection or diabetes.  IV NS bolus, IV famotidine and GI cocktail  Labs reviewed: WBC, Glucose without significant abnormality, however BUN and creatinine mildly elevated from last year.    Discussed results with pt, his pain is completely relieved. Resources provided to establish care with a PCP for follow-up of other complaints and to evaluate renal labs.  Pt is well-appearing, in no acute distress and vital signs are stable.  They appear safe to be discharged.  Return precautions provided.    Filed Vitals:   08/04/14 1932 08/04/14 2140  BP: 136/82 140/70  Pulse: 95 81  Temp: 97.9 F (36.6 C) 97.7 F (36.5 C)  TempSrc: Oral Oral  Resp: 18 18  SpO2: 98% 98%   Meds given in ED:  Medications  sodium chloride 0.9 % bolus 1,000 mL (0 mLs Intravenous Stopped 08/04/14 2117)  gi cocktail (Maalox,Lidocaine,Donnatal) (30 mLs Oral Given 08/04/14 2018)  ondansetron (ZOFRAN) injection 4 mg (4 mg Intravenous Given 08/04/14 2017)  famotidine (PEPCID) IVPB 20 mg (0 mg Intravenous Stopped 08/04/14 2047)    Discharge Medication List as of 08/04/2014 10:00 PM    START taking these medications   Details  !! omeprazole (PRILOSEC) 20 MG capsule Take 1 capsule (20 mg total) by mouth daily.,  Starting 08/04/2014, Until Discontinued, Print     !! - Potential duplicate medications found. Please discuss with provider.         Britt Bottom, NP 08/05/14 [place], MD 08/05/14 2245

## 2014-08-04 NOTE — ED Notes (Addendum)
Pt presents with c/o abdominal pain, chills, and weight loss. Pt reports that he has lost approx 30 pounds in the last 2 weeks. Pt reports nausea but denies any vomiting. Pt also has had some diarrhea. Ambulatory to triage, NAD.

## 2015-07-20 ENCOUNTER — Encounter (HOSPITAL_COMMUNITY): Payer: Self-pay | Admitting: Emergency Medicine

## 2015-07-20 ENCOUNTER — Emergency Department (HOSPITAL_COMMUNITY): Payer: 59

## 2015-07-20 ENCOUNTER — Emergency Department (HOSPITAL_COMMUNITY)
Admission: EM | Admit: 2015-07-20 | Discharge: 2015-07-20 | Disposition: A | Discharge status: Home / Self Care | Payer: 59 | Attending: Emergency Medicine | Admitting: Emergency Medicine

## 2015-07-20 DIAGNOSIS — Z79899 Other long term (current) drug therapy: Secondary | ICD-10-CM | POA: Insufficient documentation

## 2015-07-20 DIAGNOSIS — R51 Headache: Secondary | ICD-10-CM | POA: Insufficient documentation

## 2015-07-20 DIAGNOSIS — J159 Unspecified bacterial pneumonia: Secondary | ICD-10-CM | POA: Insufficient documentation

## 2015-07-20 DIAGNOSIS — J189 Pneumonia, unspecified organism: Secondary | ICD-10-CM

## 2015-07-20 DIAGNOSIS — F17210 Nicotine dependence, cigarettes, uncomplicated: Secondary | ICD-10-CM | POA: Insufficient documentation

## 2015-07-20 LAB — RAPID STREP SCREEN (MED CTR MEBANE ONLY): Streptococcus, Group A Screen (Direct): NEGATIVE

## 2015-07-20 MED ORDER — NAPROXEN 500 MG PO TABS: 500.0000 mg | ORAL_TABLET | Freq: Two times a day (BID) | ORAL | Status: DC

## 2015-07-20 MED ORDER — DOXYCYCLINE HYCLATE 100 MG PO TABS: 100.0000 mg | ORAL_TABLET | Freq: Two times a day (BID) | ORAL | Status: DC

## 2015-07-20 MED ORDER — BENZONATATE 100 MG PO CAPS: 100.0000 mg | ORAL_CAPSULE | Freq: Three times a day (TID) | ORAL | Status: DC

## 2015-07-20 MED ORDER — DOXYCYCLINE HYCLATE 100 MG PO TABS
100.0000 mg | ORAL_TABLET | Freq: Once | ORAL | Status: AC
  Administered 2015-07-20: 100 mg via ORAL
  Filled 2015-07-20: qty 1

## 2015-07-20 NOTE — ED Provider Notes (Signed)
CSN: [id number]     Arrival date & time 07/20/15  0622 History   First MD Initiated Contact with Patient 07/20/15 [phone]     Chief Complaint  Patient presents with  . URI     Patient is a 49 y.o. male presenting with URI. The history is provided by the patient.  URI Presenting symptoms: congestion, cough, fatigue, rhinorrhea and sore throat   Presenting symptoms: no fever (felt like it but has not measured a temp)   Severity:  Moderate Onset quality:  Gradual Duration:  9 days Timing:  Constant Chronicity:  New Relieved by:  Nothing Ineffective treatments:  OTC medications Associated symptoms: headaches and myalgias   Risk factors: no recent illness and no recent travel     History reviewed. No pertinent past medical history. Past Surgical History  Procedure Laterality Date  . Left  arm surgery      knife injury.   Marland Kitchen Appendectomy    . Hernia repair     Family History  Problem Relation Age of Onset  . Cancer Father   . Heart failure Mother    Social History  Substance Use Topics  . Smoking status: Current Every Day Smoker -- 0.50 packs/day for 20 years    Types: Cigarettes  . Smokeless tobacco: Never Used  . Alcohol Use: Yes     Comment: 1-2 drinks once a week.    Review of Systems  Constitutional: Positive for fatigue. Negative for fever (felt like it but has not measured a temp).  HENT: Positive for congestion, rhinorrhea and sore throat.   Respiratory: Positive for cough.   Musculoskeletal: Positive for myalgias.  Neurological: Positive for headaches.  All other systems reviewed and are negative.     Allergies  Review of patient's allergies indicates no known allergies.  Home Medications   Prior to Admission medications   Medication Sig Start Date End Date Taking? Authorizing Provider  benzonatate (TESSALON) 100 MG capsule Take 1 capsule (100 mg total) by mouth every 8 (eight) hours. 07/20/15   Dorie Rank, MD  doxycycline (VIBRA-TABS) 100 MG tablet Take 1  tablet (100 mg total) by mouth 2 (two) times daily. 07/20/15   Dorie Rank, MD  naproxen (NAPROSYN) 500 MG tablet Take 1 tablet (500 mg total) by mouth 2 (two) times daily with a meal. As needed for pain 07/20/15   Dorie Rank, MD  omeprazole (PRILOSEC) 20 MG capsule Take 20 mg by mouth daily as needed (indigestion).    Historical Provider, MD  omeprazole (PRILOSEC) 20 MG capsule Take 1 capsule (20 mg total) by mouth daily. 08/04/14   Britt Bottom, NP  pseudoephedrine (SUDAFED) 60 MG tablet Take 1 tablet (60 mg total) by mouth every 6 (six) hours as needed for congestion. 09/10/13   Noland Fordyce, PA-C  sodium chloride (OCEAN) 0.65 % SOLN nasal spray Place 1 spray into both nostrils as needed for congestion. 09/10/13   Noland Fordyce, PA-C   BP 129/82 mmHg  Pulse 103  Temp(Src) 97.9 F (36.6 C) (Oral)  Resp 18  Ht 5\' 6"  (1.676 m)  Wt 108.863 kg  BMI 38.76 kg/m2  SpO2 98% Physical Exam  Constitutional: He appears well-developed and well-nourished. No distress.  HENT:  Head: Normocephalic and atraumatic.  Right Ear: External ear normal.  Left Ear: External ear normal.  Eyes: Conjunctivae are normal. Right eye exhibits no discharge. Left eye exhibits no discharge. No scleral icterus.  Neck: Neck supple. No tracheal deviation present.  Cardiovascular: Normal rate,  regular rhythm and intact distal pulses.   Pulmonary/Chest: Effort normal and breath sounds normal. No stridor. No respiratory distress. He has no wheezes. He has no rales.  Abdominal: Soft. Bowel sounds are normal. He exhibits no distension. There is no tenderness. There is no rebound and no guarding.  Musculoskeletal: He exhibits no edema or tenderness.  Neurological: He is alert. He has normal strength. No cranial nerve deficit (no facial droop, extraocular movements intact, no slurred speech) or sensory deficit. He exhibits normal muscle tone. He displays no seizure activity. Coordination normal.  Skin: Skin is warm and dry. No  rash noted.  Psychiatric: He has a normal mood and affect.  Nursing note and vitals reviewed.   ED Course  Procedures (including critical care time) Labs Review Labs Reviewed  RAPID STREP SCREEN (NOT AT [facility])  CULTURE, GROUP A STREP    Imaging Review Dg Chest 2 View  07/20/2015  CLINICAL DATA:  49 year old male with increasing cough congestion shortness of Breath chest pain in weakness for 2 weeks with no improvement on over the counter medication. Initial encounter. EXAM: CHEST  2 VIEW COMPARISON:  04/24/2013. FINDINGS: Right upper lobe curvilinear collapse or consolidation with vague surrounding asymmetrically increased patchy opacity (arrows). No pleural effusion. Elsewhere lung parenchyma is stable. No pneumothorax or edema. Stable cardiomegaly and mediastinal contours. Visualized tracheal air column is within normal limits. No acute osseous abnormality identified. Negative visible bowel gas pattern. IMPRESSION: Right upper lobe bronchopneumonia/pneumonia. No pleural effusion. Followup PA and lateral chest X-ray is recommended in 3-4 weeks following trial of antibiotic therapy to ensure resolution and exclude underlying malignancy. Electronically Signed   By: Genevie Ann M.D.   On: 07/20/2015 07:39   I have personally reviewed and evaluated these images and lab results as part of my medical decision-making.   MDM   Final diagnoses:  CAP (community acquired pneumonia)    The patient presents to the emergency room with complaints of sore throat, coughing and congestion for the last 2 weeks. Patient strep test is negative however his chest x-ray does show a right-sided bronchopneumonia.  The patient otherwise is healthy without significant comorbidities. His oxygen saturation is normal. He is not febrile or any distress.  I will discharge him home on a course of antibiotics. Discussed outpatient follow-up and a need for follow-up chest x-ray in 3-4 weeks.    Dorie Rank, MD 07/20/15 [phone]

## 2015-07-20 NOTE — ED Notes (Signed)
Pt c/o cough, sore throat, headache, sinus drainage, fever, chills body aches x 2 weeks. Pt has been using OTC meds but now feels weak with decreased appetite

## 2015-07-20 NOTE — Discharge Instructions (Signed)

## 2015-07-22 LAB — CULTURE, GROUP A STREP: STREP A CULTURE: NEGATIVE

## 2018-09-24 NOTE — Congregational Nurse Program (Signed)
  Dept: [facility] Nurse Program Note  Date of Encounter: 09/18/2018  Past Medical History: No past medical history on file.  Encounter Details: CNP Questionnaire - 09/24/18 2342      Questionnaire   Patient Status  Not Applicable    Race  Black or African American    Location Patient Served At  Not Applicable    Insurance  Not Applicable    Uninsured  Uninsured (NEW 1x/quarter)    Food  No food insecurities    Housing/Utilities  No permanent housing    Transportation  No transportation needs    Interpersonal Safety  Yes, feel physically and emotionally safe where you currently live    Medication  No medication insecurities    Medical Provider  No    Referrals  Area Agency;[facility]/Care Connects;Primary Care Provider/Clinic    ED Visit Averted  Not Applicable    Life-Saving Intervention Made  Not Applicable     Initial visit today for this young man that presents with feeling dizzy at  Times started about 2 weeks ago ,o no medication ,smokes about 3-4 per day .was told several months ago blood pressure may be border line.works in [place] at  [employer] and will get insurance soon. (after 90 days on his job).Has no PCP referred ,given number to call for an appointment. Has been in prison ,mentioned briefly.  Counseled regarding high blood pressure and the need to get it under control ,states he amy go to an urgent care if he needs to nurse will follow up with checking blood pressure next week and follow up with appointment for PCP visit  Eye appointment will check on visit by vision Lucianne Lei and give date to client

## 2018-09-24 NOTE — Congregational Nurse Program (Signed)
  Dept: [facility] Nurse Program Note  Date of Encounter: 09/24/2018  Past Medical History: No past medical history on file.  Encounter Details: CNP Questionnaire - 09/24/18 2352      Questionnaire   Patient Status  Not Applicable    Race  Black or African American    Location Patient Served At  Not Applicable    Insurance  Not Applicable    Uninsured  Uninsured (Subsequent visits/quarter)    Food  No food insecurities    Housing/Utilities  No permanent housing    Transportation  No transportation needs    Interpersonal Safety  Yes, feel physically and emotionally safe where you currently live    Medication  No medication insecurities    Medical Provider  Yes    Referrals  Area Agency;Primary Care Provider/Clinic;[facility]    ED Visit Averted  Not Applicable    Life-Saving Intervention Made  Not Applicable     follow up visit with client to recheck blood pressure. Client followed up and made appointment to establish PCP. Appointment made for 10-07-2018 at [facility]. Praised client for his follow up . Blood pressure down today but still hypersentive .Referral made to see Dr Asencion Noble at [facility] on tomorrow for elevation of his blood pressure.Client given dates  for vision Lucianne Lei 12-06-2018 [employer] .Client is a smoker ,3-4 per day ,1 pack every 2 weeks . Follow up at referral visit, will monitor blood pressure

## 2018-10-07 ENCOUNTER — Ambulatory Visit: Payer: 59 | Admitting: Family Medicine

## 2018-10-08 ENCOUNTER — Telehealth: Payer: Self-pay

## 2018-10-08 NOTE — Telephone Encounter (Signed)
Nurse called client once she found out he had been discharged from [facility]. Client answered and informed nurse he was no longer at the [facility]. Ask if he kept his PCP appointment at the clinic on 2-24 he states no . He didn't call to cancel.Counseled regarding broken appointments. He started getting his insurance from his job and encouraged to reschedule or connect with some PCP. Counseled regarding the need to get baseline blood work and monitor blood pressure. Client still has referral papers . Will call and secure PCP  . States he will let nurse know when he follows up. Wished him well

## 2019-05-09 ENCOUNTER — Encounter: Payer: Self-pay | Admitting: Nurse Practitioner

## 2019-05-09 ENCOUNTER — Ambulatory Visit (INDEPENDENT_AMBULATORY_CARE_PROVIDER_SITE_OTHER): Payer: Self-pay | Admitting: Nurse Practitioner

## 2019-05-09 VITALS — BP 144/77 | HR 70 | Temp 97.4°F | Ht 66.0 in | Wt 236.0 lb

## 2019-05-09 DIAGNOSIS — R1013 Epigastric pain: Secondary | ICD-10-CM

## 2019-05-09 DIAGNOSIS — A048 Other specified bacterial intestinal infections: Secondary | ICD-10-CM

## 2019-05-09 DIAGNOSIS — D649 Anemia, unspecified: Secondary | ICD-10-CM

## 2019-05-09 DIAGNOSIS — G8929 Other chronic pain: Secondary | ICD-10-CM

## 2019-05-09 MED ORDER — NA SULFATE-K SULFATE-MG SULF 17.5-3.13-1.6 GM/177ML PO SOLN: ORAL | 0 refills | Status: DC

## 2019-05-09 NOTE — Patient Instructions (Addendum)
If you are age 54 or older, your body mass index should be between 23-30. Your Body mass index is 38.09 kg/m. If this is out of the aforementioned range listed, please consider follow up with your Primary Care Provider.  If you are age 5 or younger, your body mass index should be between 19-25. Your Body mass index is 38.09 kg/m. If this is out of the aformentioned range listed, please consider follow up with your Primary Care Provider.   You have been scheduled for an endoscopy and colonoscopy. Please follow the written instructions given to you at your visit today. Please pick up your prep supplies at the pharmacy within the next 1-3 days. If you use inhalers (even only as needed), please bring them with you on the day of your procedure. Your physician has requested that you go to www.startemmi.com and enter the access code given to you at your visit today. This web site gives a general overview about your procedure. However, you should still follow specific instructions given to you by our office regarding your preparation for the procedure.  We have sent the following medications to your pharmacy for you to pick up at your convenience: [facility] provider has requested that you go to the basement level for lab work before leaving today. Press "B" on the elevator. The lab is located at the first door on the left as you exit the elevator.  Call on Monday with list of home medications.     Thank you for choosing me and [facility].   [name], NP

## 2019-05-09 NOTE — Progress Notes (Addendum)
ASSESSMENT / PLAN:   1. 53 yo male with chronic upper abdominal pain. In absence of weight loss or other concerning features after 15 years, doubt significant pathology. However, he does take NSAIDs, also H.pylori stool antigen positive. Currently on treatment for H.pylori and has noticed improvement in upper abdominal discomfort.  --Complete antibiotics for H.pylori.  --I assume patient is on a PPI but he doesn't know. He will call us with list of home medications.   ADDENDUM: Received records from Dr. Laural Benes on 06/09/19 For H. pylori patient was treated with clarithromycin, amoxicillin and Protonix  2. [place] anemia, hgb 12.3. No overt GI bleeding.  --Obtain ferritin, TIBC --For further evaluation of anemia patient will be scheduled for EGD and colonoscopy. The risks and benefits of EGD and colonoscopy with possible polypectomy / biopsies were discussed and the patient agrees to proceed.   HPI:    Referring Provider:    Geannie Risen, MD Reason for referral:    Abdominal pain and anemia  Chief Complaint:   Abdominal pain  Kyle Lucas is a 53 year old male with a PMH significant for HTN, obesity, HBV infection ( resolved)  Kyle Lucas gives a 15-year history of intermittent epigastric pain unrelated to eating or activity.  Episodes can last for hours.  No history of PUD, he has no associated nausea nor vomiting.  Reports normal bowel movements.  He does take NSAIDs in the form of ibuprofen and sometimes Goody powders.  He is currently on antibiotics for H. pylori infection detected by stool antigen.  Patient does not have a list of his home medications and doesn't know what he takes.  He has noted some improvement in the upper abdominal pain while on treatment for H. Pylori.  He has no lower GI complaints.  Bowel movements normal, no blood in stool.    Data Reviewed:  Lab core 04/24/2019 WBC 4.9, hemoglobin 12.3, hematocrit 35, platelets 349, MCV 85 CMET normal.  HBV surface  antibody qualitative -reactive HBV surface antigen-negative HCV antibody negative    Past Medical History:  Diagnosis Date  . Anemia   . Hepatitis   . Hypertension      Past Surgical History:  Procedure Laterality Date  . APPENDECTOMY    . HERNIA REPAIR    . left  arm surgery     knife injury.    Family History  Problem Relation Age of Onset  . Heart failure Mother   . Cancer Father    Social History   Tobacco Use  . Smoking status: Current Every Day Smoker    Packs/day: 0.50    Years: 20.00    Pack years: 10.00    Types: Cigarettes  . Smokeless tobacco: Never Used  Substance Use Topics  . Alcohol use: Yes    Comment: 1-2 drinks once a week.  . Drug use: No   No current outpatient medications on file.   No current facility-administered medications for this visit.    No Known Allergies   Review of Systems: All systems reviewed and negative except where noted in HPI.   Physical Exam:    Wt Readings from Last 3 Encounters:  05/09/19 236 lb (107 kg)  09/24/18 240 lb (108.9 kg)  07/20/15 240 lb (108.9 kg)    BP (!) 144/77   Pulse 70   Temp (!) 97.4 F (36.3 C)   Ht 5\' 6"  (1.676 m)   Wt  236 lb (107 kg)   SpO2 100%   BMI 38.09 kg/m  Constitutional:  Pleasant male in no acute distress. Psychiatric: Normal mood and affect. Behavior is normal. EENT: Pupils normal.  Conjunctivae are normal. No scleral icterus. Neck supple.  Cardiovascular: Normal rate, regular rhythm. No edema Pulmonary/chest: Effort normal and breath sounds normal. No wheezing, rales or rhonchi. Abdominal: Soft, nondistended, nontender. Bowel sounds active throughout. There are no masses palpable. No hepatomegaly. Neurological: Alert and oriented to person place and time. Skin: Skin is warm and dry. No rashes noted.  Kyle Savoy, NP  05/09/2019, 3:12 PM  Cc: Geannie Risen, MD

## 2019-05-12 ENCOUNTER — Telehealth: Payer: Self-pay | Admitting: Nurse Practitioner

## 2019-05-12 ENCOUNTER — Encounter: Payer: Self-pay | Admitting: Nurse Practitioner

## 2019-05-12 NOTE — Telephone Encounter (Signed)
Kyle Lucas did you need the medications the pt is taking.  He called with the list

## 2019-05-12 NOTE — Telephone Encounter (Signed)
If Kyle Lucas asked for the meds I would make sure she has them.  I guess send this to her.

## 2019-05-13 NOTE — Addendum Note (Signed)
Addended by: Lavena Bullion on: 05/13/2019 04:52 PM   Modules accepted: Level of Service

## 2019-05-13 NOTE — Telephone Encounter (Signed)
Ok, that is helpful. Thanks

## 2019-05-13 NOTE — Progress Notes (Signed)
Agree with the assessment and plan as outlined by Rubbie Battiest, NP. Hemoglobin 12.3 back in 2015.  I do not find any repeat in EMR.  Plan to repeat CBC at time of iron panel.  Otherwise, agree with plan for EGD for upper GI symptoms along with colonoscopy for history along with routine CRC screening.  Plan for additional work-up if iron panel demonstrates deficiency.  Kyle Wimberley, DO, [facility]

## 2019-05-26 ENCOUNTER — Encounter: Payer: Self-pay | Admitting: Gastroenterology

## 2019-05-28 ENCOUNTER — Telehealth: Payer: Self-pay

## 2019-05-28 NOTE — Telephone Encounter (Signed)
Covid-19 screening questions   Do you now or have you had a fever in the last 14 days? NO   Do you have any respiratory symptoms of shortness of breath or cough now or in the last 14 days? NO  Do you have any family members or close contacts with diagnosed or suspected Covid-19 in the past 14 days? NO  Have you been tested for Covid-19 and found to be positive? NO        

## 2019-05-29 ENCOUNTER — Encounter: Payer: Self-pay | Admitting: Gastroenterology

## 2019-05-29 ENCOUNTER — Other Ambulatory Visit: Payer: Self-pay

## 2019-05-29 ENCOUNTER — Ambulatory Visit (AMBULATORY_SURGERY_CENTER): Payer: BC Managed Care – PPO | Admitting: Gastroenterology

## 2019-05-29 VITALS — BP 146/94 | HR 70 | Temp 97.9°F | Resp 15 | Ht 66.0 in | Wt 236.0 lb

## 2019-05-29 DIAGNOSIS — K222 Esophageal obstruction: Secondary | ICD-10-CM | POA: Diagnosis not present

## 2019-05-29 DIAGNOSIS — K2980 Duodenitis without bleeding: Secondary | ICD-10-CM

## 2019-05-29 DIAGNOSIS — Z1211 Encounter for screening for malignant neoplasm of colon: Secondary | ICD-10-CM

## 2019-05-29 DIAGNOSIS — D123 Benign neoplasm of transverse colon: Secondary | ICD-10-CM | POA: Diagnosis not present

## 2019-05-29 DIAGNOSIS — D128 Benign neoplasm of rectum: Secondary | ICD-10-CM

## 2019-05-29 DIAGNOSIS — K621 Rectal polyp: Secondary | ICD-10-CM | POA: Diagnosis not present

## 2019-05-29 DIAGNOSIS — D125 Benign neoplasm of sigmoid colon: Secondary | ICD-10-CM

## 2019-05-29 DIAGNOSIS — K635 Polyp of colon: Secondary | ICD-10-CM

## 2019-05-29 DIAGNOSIS — K2950 Unspecified chronic gastritis without bleeding: Secondary | ICD-10-CM | POA: Diagnosis not present

## 2019-05-29 DIAGNOSIS — D649 Anemia, unspecified: Secondary | ICD-10-CM

## 2019-05-29 MED ORDER — PANTOPRAZOLE SODIUM 40 MG PO TBEC: 40.0000 mg | DELAYED_RELEASE_TABLET | Freq: Two times a day (BID) | ORAL | 0 refills | Status: DC

## 2019-05-29 MED ORDER — SODIUM CHLORIDE 0.9 % IV SOLN: 500.0000 mL | Freq: Once | INTRAVENOUS | Status: DC

## 2019-05-29 NOTE — Patient Instructions (Signed)
Please read handouts provided. Use Protonix  ( pantoprazole ) 40 mg twice daily for 8 weeks. Stop Ibuprofen, naproxen, or other non-steriodal anti-inflammatory drugs. Repeat upper endoscopy in 8 weeks to check healing. Obtain iron panel as previously ordered. Await pathology results. Continue present medications.      YOU HAD AN ENDOSCOPIC PROCEDURE TODAY AT [facility]:   Refer to the procedure report that was given to you for any specific questions about what was found during the examination.  If the procedure report does not answer your questions, please call your gastroenterologist to clarify.  If you requested that your care partner not be given the details of your procedure findings, then the procedure report has been included in a sealed envelope for you to review at your convenience later.  YOU SHOULD EXPECT: Some feelings of bloating in the abdomen. Passage of more gas than usual.  Walking can help get rid of the air that was put into your GI tract during the procedure and reduce the bloating. If you had a lower endoscopy (such as a colonoscopy or flexible sigmoidoscopy) you may notice spotting of blood in your stool or on the toilet paper. If you underwent a bowel prep for your procedure, you may not have a normal bowel movement for a few days.  Please Note:  You might notice some irritation and congestion in your nose or some drainage.  This is from the oxygen used during your procedure.  There is no need for concern and it should clear up in a day or so.  SYMPTOMS TO REPORT IMMEDIATELY:   Following lower endoscopy (colonoscopy or flexible sigmoidoscopy):  Excessive amounts of blood in the stool  Significant tenderness or worsening of abdominal pains  Swelling of the abdomen that is new, acute  Fever of 100F or higher   Following upper endoscopy (EGD)  Vomiting of blood or coffee ground material  New chest pain or pain under the shoulder blades  Painful or  persistently difficult swallowing  New shortness of breath  Fever of 100F or higher  Black, tarry-looking stools  For urgent or emergent issues, a gastroenterologist can be reached at any hour by calling [phone].   DIET:  We do recommend a small meal at first, but then you may proceed to your regular diet.  Drink plenty of fluids but you should avoid alcoholic beverages for 24 hours.  ACTIVITY:  You should plan to take it easy for the rest of today and you should NOT DRIVE or use heavy machinery until tomorrow (because of the sedation medicines used during the test).    FOLLOW UP: Our staff will call the number listed on your records 48-72 hours following your procedure to check on you and address any questions or concerns that you may have regarding the information given to you following your procedure. If we do not reach you, we will leave a message.  We will attempt to reach you two times.  During this call, we will ask if you have developed any symptoms of COVID 19. If you develop any symptoms (ie: fever, flu-like symptoms, shortness of breath, cough etc.) before then, please call [phone].  If you test positive for Covid 19 in the 2 weeks post procedure, please call and report this information to [place].    If any biopsies were taken you will be contacted by phone or by letter within the next 1-3 weeks.  Please call us at [phone] if you have  not heard about the biopsies in 3 weeks.    SIGNATURES/CONFIDENTIALITY: You and/or your care partner have signed paperwork which will be entered into your electronic medical record.  These signatures attest to the fact that that the information above on your After Visit Summary has been reviewed and is understood.  Full responsibility of the confidentiality of this discharge information lies with you and/or your care-partner.

## 2019-05-29 NOTE — Op Note (Signed)
[facility] Patient Name: Kyle Lucas Procedure Date: 05/29/2019 1:32 PM MRN: [id number] Endoscopist: Gerrit Heck , MD Age: 53 Referring MD:  Date of Birth: 10-Mar-1966 Gender: Male Account #: [id number] Procedure:                Colonoscopy Indications:              Screening for colorectal malignant neoplasm, This                            is the patient's first colonoscopy. No lower GI                            symptoms and no known FHx of CRC. Medicines:                Monitored Anesthesia Care Procedure:                Pre-Anesthesia Assessment:                           - Prior to the procedure, a History and Physical                            was performed, and patient medications and                            allergies were reviewed. The patient's tolerance of                            previous anesthesia was also reviewed. The risks                            and benefits of the procedure and the sedation                            options and risks were discussed with the patient.                            All questions were answered, and informed consent                            was obtained. Prior Anticoagulants: The patient has                            taken no previous anticoagulant or antiplatelet                            agents. ASA Grade Assessment: II - A patient with                            mild systemic disease. After reviewing the risks                            and benefits, the patient was deemed in  satisfactory condition to undergo the procedure.                           After obtaining informed consent, the colonoscope                            was passed under direct vision. Throughout the                            procedure, the patient's blood pressure, pulse, and                            oxygen saturations were monitored continuously. The                            Colonoscope was introduced  through the anus and                            advanced to the the terminal ileum. The colonoscopy                            was performed without difficulty. The patient                            tolerated the procedure well. The quality of the                            bowel preparation was adequate. The terminal ileum,                            ileocecal valve, appendiceal orifice, and rectum                            were photographed. Scope In: 1:53:11 PM Scope Out: 2:15:32 PM Scope Withdrawal Time: 0 hours 19 minutes 40 seconds  Total Procedure Duration: 0 hours 22 minutes 21 seconds  Findings:                 The perianal and digital rectal examinations were                            normal.                           Two sessile polyps were found in the transverse                            colon. The polyps were 2 to 3 mm in size. These                            polyps were removed with a cold biopsy forceps.                            Resection and retrieval were complete. Estimated  blood loss was minimal.                           Two sessile polyps were found in the sigmoid colon.                            The polyps were 2 to 4 mm in size. These polyps                            were removed with a cold snare. Resection and                            retrieval were complete. Estimated blood loss was                            minimal.                           A few sessile polyps were found in the rectum and                            recto-sigmoid colon. The polyps were 1 to 4 mm in                            size. Several of these polyps were removed with a                            cold forceps and cold snare (1) for histologic                            representative evaluation. Resection and retrieval                            were complete. Estimated blood loss was minimal.                           Multiple small and  large-mouthed diverticula were                            found in the sigmoid colon.                           Non-bleeding internal hemorrhoids were found during                            retroflexion. The hemorrhoids were small.                           The terminal ileum appeared normal. Complications:            No immediate complications. Estimated Blood Loss:     Estimated blood loss was minimal. Impression:               - Two 2 to 3 mm polyps in the transverse colon,  removed with a cold biopsy forceps. Resected and                            retrieved.                           - Two 2 to 4 mm polyps in the sigmoid colon,                            removed with a cold snare. Resected and retrieved.                           - A few 1 to 4 mm polyps in the rectum and at the                            recto-sigmoid colon, removed with a cold snare.                            Resected and retrieved.                           - Diverticulosis in the sigmoid colon.                           - Non-bleeding internal hemorrhoids.                           - The examined portion of the ileum was normal. Recommendation:           - Patient has a contact number available for                            emergencies. The signs and symptoms of potential                            delayed complications were discussed with the                            patient. Return to normal activities tomorrow.                            Written discharge instructions were provided to the                            patient.                           - Resume previous diet.                           - Continue present medications.                           - Await pathology results.                           -  Repeat colonoscopy for surveillance based on                            pathology results.                           - Internal hemorrhoids were noted on this study and                             may be amenable to hemorrhoid band ligation. If you                            are interested in further treatment of these                            hemorrhoids with band ligation, please contact my                            clinic to set up an appointment for evaluation and                            treatment. Gerrit Heck, MD 05/29/2019 2:33:25 PM

## 2019-05-29 NOTE — Progress Notes (Signed)
Report to PACU, RN, vss, BBS= Clear.  

## 2019-05-29 NOTE — Progress Notes (Signed)
Called to room to assist during endoscopic procedure.  Patient ID and intended procedure confirmed with present staff. Received instructions for my participation in the procedure from the performing physician.  

## 2019-05-29 NOTE — Progress Notes (Signed)
Pt's states no medical or surgical changes since previsit or office visit.  JB - temp CW - vitals. 

## 2019-05-29 NOTE — Progress Notes (Signed)
Patient unable to schedule upper endoscopy today, wants to check his schedule and will call back to schedule,  B.Zavien Clubb RN.

## 2019-05-29 NOTE — Op Note (Signed)
[facility] Patient Name: Kyle Lucas Procedure Date: 05/29/2019 1:33 PM MRN: [id number] Endoscopist: Gerrit Heck , MD Age: 53 Referring MD:  Date of Birth: [birth year] Gender: Male Account #: [id number] Procedure:                Upper GI endoscopy Indications:              Epigastric abdominal pain, Follow-up of                            Helicobacter pylori, Anemia                           53 yo male with long-standing history of                            intermittent MEG pain. Recently diagnosed with H                            pylori, treated with triple therapy. Also with                            history of mild, normocytic anemia. Medicines:                Monitored Anesthesia Care Procedure:                Pre-Anesthesia Assessment:                           - Prior to the procedure, a History and Physical                            was performed, and patient medications and                            allergies were reviewed. The patient's tolerance of                            previous anesthesia was also reviewed. The risks                            and benefits of the procedure and the sedation                            options and risks were discussed with the patient.                            All questions were answered, and informed consent                            was obtained. Prior Anticoagulants: The patient has                            taken no previous anticoagulant or antiplatelet  agents. ASA Grade Assessment: II - A patient with                            mild systemic disease. After reviewing the risks                            and benefits, the patient was deemed in                            satisfactory condition to undergo the procedure.                           After obtaining informed consent, the endoscope was                            passed under direct vision. Throughout the                             procedure, the patient's blood pressure, pulse, and                            oxygen saturations were monitored continuously. The                            Endoscope was introduced through the mouth, and                            advanced to the third part of duodenum. The upper                            GI endoscopy was accomplished without difficulty.                            The patient tolerated the procedure well. Scope In: Scope Out: Findings:                 The examined esophagus was normal.                           The Z-line was regular and was found 40 cm from the                            incisors.                           Localized area of mildly nodular mucosa was found                            in the cardia. Biopsies were taken with a cold                            forceps for histology. Estimated blood loss was  minimal.                           Scattered moderate inflammation characterized by                            erosions, erythema and shallow ulcerations was                            found in the gastric body, at the incisura and in                            the gastric antrum. Biopsies were taken with a cold                            forceps for histology. Estimated blood loss was                            minimal.                           Localized moderate inflammation characterized by                            congestion (edema), erosions, erythema and shallow                            ulcerations was found in the duodenal bulb and in                            the first portion of the duodenum. Biopsies were                            taken with a cold forceps for histology. Estimated                            blood loss was minimal.                           An acquired benign-appearing, intrinsic very mild                            stenosis was found in the first portion of the                             duodenum and was easily traversed. Endoscopic                            appearance suspicious for NSAID diaphragm                            stricture. Biopsies were taken with a cold forceps                            for histology. Estimated  blood loss was minimal.                           The second portion of the duodenum and third                            portion of the duodenum were normal. Complications:            No immediate complications. Estimated Blood Loss:     Estimated blood loss was minimal. Impression:               - Normal esophagus.                           - Z-line regular, 40 cm from the incisors.                           - Nodular mucosa in the cardia. Biopsied.                           - Gastritis. Biopsied.                           - Duodenitis. Biopsied.                           - Acquired duodenal stenosis. Biopsied.                           - Normal second portion of the duodenum and third                            portion of the duodenum. Recommendation:           - Patient has a contact number available for                            emergencies. The signs and symptoms of potential                            delayed complications were discussed with the                            patient. Return to normal activities tomorrow.                            Written discharge instructions were provided to the                            patient.                           - Resume previous diet.                           - Use Protonix (pantoprazole) 40 mg PO BID for 8  weeks.                           - Stop ibuprofen, naproxen, or other non-steroidal                            anti-inflammatory drugs (NSAIDs).                           - Repeat upper endoscopy in 8 weeks to check                            healing.                           - Obtain iron panel as previously ordered. Gerrit Heck, MD 05/29/2019 2:28:18  PM

## 2019-06-02 ENCOUNTER — Telehealth: Payer: Self-pay | Admitting: *Deleted

## 2019-06-02 ENCOUNTER — Telehealth: Payer: Self-pay

## 2019-06-02 NOTE — Telephone Encounter (Signed)
  Follow up Call-  Call back number 05/29/2019  Post procedure Call Back phone  # [phone]  Permission to leave phone message Yes  Some recent data might be hidden     Patient questions:  Message left to call us if necessary.

## 2019-06-02 NOTE — Telephone Encounter (Signed)
   For urgent or emergent issues, a gastroenterologist can be reached at any hour by calling [phone].  1. Have you developed a fever since your procedure?no  2.   Have you had an respiratory symptoms (SOB or cough) since your procedure? no  3.   Have you tested positive for COVID 19 since your procedure no  4.   Have you had any family members/close contacts diagnosed with the COVID 19 since your procedure?  no   If yes to any of these questions please route to Joylene John, RN and Alphonsa Gin, Therapist, sports.   Follow up Call-  Call back number 05/29/2019  Post procedure Call Back phone  # [phone]  Permission to leave phone message Yes  Some recent data might be hidden     Patient questions:  Do you have a fever, pain , or abdominal swelling? No. Pain Score  0   Have you tolerated food without any problems? Yes.    Have you been able to return to your normal activities? Yes.    Do you have any questions about your discharge instructions: Diet   no Medications  No. Follow up visit  No.  Do you have questions or concerns about your Care? No.  Actions: * If pain score is 4 or above: No action needed, pain <4.

## 2019-06-02 NOTE — Telephone Encounter (Signed)
Pt stated that he is doing fine and no need for call back.

## 2019-06-05 ENCOUNTER — Encounter: Payer: Self-pay | Admitting: Gastroenterology

## 2019-06-18 ENCOUNTER — Encounter: Payer: Self-pay | Admitting: Gastroenterology

## 2019-08-28 ENCOUNTER — Ambulatory Visit
Admission: RE | Admit: 2019-08-28 | Discharge: 2019-08-28 | Disposition: A | Discharge status: Home / Self Care | Payer: Self-pay | Source: Ambulatory Visit | Attending: Obstetrics and Gynecology | Admitting: Obstetrics and Gynecology

## 2019-08-28 ENCOUNTER — Other Ambulatory Visit: Payer: Self-pay

## 2019-08-28 ENCOUNTER — Other Ambulatory Visit: Payer: Self-pay | Admitting: Obstetrics and Gynecology

## 2019-08-28 DIAGNOSIS — S6721XD Crushing injury of right hand, subsequent encounter: Secondary | ICD-10-CM

## 2020-01-18 ENCOUNTER — Encounter (HOSPITAL_COMMUNITY): Payer: Self-pay

## 2020-01-18 ENCOUNTER — Emergency Department (HOSPITAL_COMMUNITY)
Admission: EM | Admit: 2020-01-18 | Discharge: 2020-01-18 | Discharge status: Against Medical Advice | Payer: Self-pay | Attending: Emergency Medicine | Admitting: Emergency Medicine

## 2020-01-18 ENCOUNTER — Other Ambulatory Visit: Payer: Self-pay

## 2020-01-18 DIAGNOSIS — F17210 Nicotine dependence, cigarettes, uncomplicated: Secondary | ICD-10-CM | POA: Insufficient documentation

## 2020-01-18 DIAGNOSIS — G8921 Chronic pain due to trauma: Secondary | ICD-10-CM | POA: Insufficient documentation

## 2020-01-18 DIAGNOSIS — M79641 Pain in right hand: Secondary | ICD-10-CM

## 2020-01-18 DIAGNOSIS — Z5320 Procedure and treatment not carried out because of patient's decision for unspecified reasons: Secondary | ICD-10-CM | POA: Insufficient documentation

## 2020-01-18 NOTE — ED Provider Notes (Signed)
[facility] EMERGENCY DEPARTMENT Provider Note   CSN: [id number] Arrival date & time: 01/18/20  1116     History No chief complaint on file.   Barbara Keng is a 54 y.o. male.  HPI    Patient presents concern of ongoing right hand pain. Patient states that he is generally well, denies chronic issues. 1 month ago the patient injured his hand at work with accident.  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 He notes that 3 days later he had an x-ray which was reportedly normal. However, now he presents with concern for ongoing swelling in the dorsum of his right hand, inability to fully grasp in that area, and diffuse soreness. Transient relief with OTC medication, nothing sustained. No other complaints, no other injuries.  Past Medical History:  Diagnosis Date  . Anemia   . Hepatitis   . Hypertension     Patient Active Problem List   Diagnosis Date Noted  . Chest pain 04/24/2013  . Obesity 04/24/2013  . Tobacco abuse 04/24/2013  . Probable Obstructive sleep apnea with fatigue 04/24/2013  . Heat intolerance 04/24/2013  . Hyperglycemia 04/24/2013    Past Surgical History:  Procedure Laterality Date  . APPENDECTOMY    . HERNIA REPAIR    . left  arm surgery     knife injury.        Family History  Problem Relation Age of Onset  . Heart failure Mother   . Cancer Father   . Colon cancer Neg Hx   . Esophageal cancer Neg Hx   . Rectal cancer Neg Hx   . Stomach cancer Neg Hx     Social History   Tobacco Use  . Smoking status: Current Every Day Smoker    Packs/day: 0.50    Years: 20.00    Pack years: 10.00    Types: Cigarettes  . Smokeless tobacco: Never Used  Substance Use Topics  . Alcohol use: Yes    Comment: 1-2 drinks once a week.  . Drug use: No    Home Medications Prior to Admission medications   Medication Sig Start Date End Date Taking? Authorizing Provider    pantoprazole (PROTONIX) 40 MG tablet Take 1 tablet (40 mg total) by mouth 2 (two) times daily. 05/29/19   Cirigliano, Vito V, DO    Allergies    Patient has no known allergies.  Review of Systems   Review of Systems  Constitutional:       Per HPI, otherwise negative  HENT:       Per HPI, otherwise negative  Respiratory:       Per HPI, otherwise negative  Cardiovascular:       Per HPI, otherwise negative  Gastrointestinal: Negative for vomiting.  Endocrine:       Negative aside from HPI  Genitourinary:       Neg aside from HPI   Musculoskeletal:       Per HPI, otherwise negative  Skin: Negative.   Neurological: Negative for syncope.    Physical Exam Updated Vital Signs BP (!) 145/85 (BP Location: Right Arm)   Pulse 75   Temp 98.1 F (36.7 C) (Oral)   Resp 18   SpO2 98%   Physical Exam Vitals and nursing note reviewed.  Constitutional:      General: He is not in acute distress.    Appearance: He is well-developed.  HENT:     Head:  Normocephalic and atraumatic.  Eyes:     Conjunctiva/sclera: Conjunctivae normal.  Cardiovascular:     Rate and Rhythm: Normal rate and regular rhythm.  Pulmonary:     Effort: Pulmonary effort is normal. No respiratory distress.     Breath sounds: No stridor.  Abdominal:     General: There is no distension.  Musculoskeletal:     Right forearm: Normal.     Right wrist: Normal.     Right hand: Swelling, tenderness and bony tenderness present. Decreased range of motion. Normal sensation. Normal capillary refill. Normal pulse.  Skin:    General: Skin is warm and dry.  Neurological:     Mental Status: He is alert and oriented to person, place, and time.     ED Results / Procedures / Treatments   Labs (all labs ordered are listed, but only abnormal results are displayed) Labs Reviewed - No data to display  EKG None  Radiology No results found.  Procedures Procedures (including critical care time)  Medications Ordered in  ED Medications - No data to display  ED Course  I have reviewed the triage vital signs and the nursing notes.  Pertinent labs & imaging results that were available during my care of the patient were reviewed by me and considered in my medical decision making (see chart for details).   With concern for naproxen versus fracture versus contusion versus hematoma, patient had x-rays performed, medication provided.  Patient eloped prior to having his x-ray performed. Though the patient was 1 month out from the initial injury, was in no distress, he did have some ongoing pain, inability to grasp, there is concern for occult fracture that may have been elucidated today. However, the patient left prior to obtaining the studies.   Final Clinical Impression(s) / ED Diagnoses Final diagnoses:  Pain of right hand    Rx / DC Orders ED Discharge Orders    None       Carmin Muskrat, MD 01/18/20 1743

## 2020-01-18 NOTE — ED Notes (Signed)
PT eloped

## 2020-01-18 NOTE — ED Triage Notes (Addendum)
Patient complains of ongoing right hand pain after shutting hand in car hood. Had negative xray and continues to complain of swelling and finger pain

## 2020-01-18 NOTE — ED Notes (Signed)
Pt notified Registration at [employer] that he is leaving b/c he can not wait any longer.

## 2020-02-06 ENCOUNTER — Other Ambulatory Visit: Payer: Self-pay

## 2020-02-06 ENCOUNTER — Emergency Department (HOSPITAL_COMMUNITY): Payer: BC Managed Care – PPO

## 2020-02-06 ENCOUNTER — Emergency Department (HOSPITAL_COMMUNITY)
Admission: EM | Admit: 2020-02-06 | Discharge: 2020-02-06 | Disposition: A | Discharge status: Home / Self Care | Payer: BC Managed Care – PPO | Attending: Emergency Medicine | Admitting: Emergency Medicine

## 2020-02-06 ENCOUNTER — Encounter (HOSPITAL_COMMUNITY): Payer: Self-pay | Admitting: Emergency Medicine

## 2020-02-06 DIAGNOSIS — I10 Essential (primary) hypertension: Secondary | ICD-10-CM | POA: Diagnosis not present

## 2020-02-06 DIAGNOSIS — Z79899 Other long term (current) drug therapy: Secondary | ICD-10-CM | POA: Insufficient documentation

## 2020-02-06 DIAGNOSIS — F17210 Nicotine dependence, cigarettes, uncomplicated: Secondary | ICD-10-CM | POA: Diagnosis not present

## 2020-02-06 DIAGNOSIS — G8929 Other chronic pain: Secondary | ICD-10-CM

## 2020-02-06 DIAGNOSIS — R519 Headache, unspecified: Secondary | ICD-10-CM | POA: Diagnosis not present

## 2020-02-06 DIAGNOSIS — R7989 Other specified abnormal findings of blood chemistry: Secondary | ICD-10-CM

## 2020-02-06 DIAGNOSIS — R42 Dizziness and giddiness: Secondary | ICD-10-CM | POA: Diagnosis not present

## 2020-02-06 LAB — BASIC METABOLIC PANEL
Anion gap: 12 (ref 5–15)
BUN: 19 mg/dL (ref 6–20)
CO2: 24 mmol/L (ref 22–32)
Calcium: 9.3 mg/dL (ref 8.9–10.3)
Chloride: 105 mmol/L (ref 98–111)
Creatinine, Ser: 1.66 mg/dL — ABNORMAL HIGH (ref 0.61–1.24)
GFR calc Af Amer: 53 mL/min — ABNORMAL LOW (ref >60)
GFR calc non Af Amer: 46 mL/min — ABNORMAL LOW (ref >60)
Glucose, Bld: 99 mg/dL (ref 70–99)
Potassium: 4.2 mmol/L (ref 3.5–5.1)
Sodium: 141 mmol/L (ref 135–145)

## 2020-02-06 LAB — URINALYSIS, ROUTINE W REFLEX MICROSCOPIC
Glucose, UA: NEGATIVE mg/dL
Hgb urine dipstick: NEGATIVE
Ketones, ur: NEGATIVE mg/dL
Leukocytes,Ua: NEGATIVE
Nitrite: NEGATIVE
Protein, ur: NEGATIVE mg/dL
Specific Gravity, Urine: 1.032 — ABNORMAL HIGH (ref 1.005–1.030)
pH: 5 (ref 5.0–8.0)

## 2020-02-06 LAB — CBC
HCT: 41.6 % (ref 39.0–52.0)
Hemoglobin: 13.3 g/dL (ref 13.0–17.0)
MCH: 29.4 pg (ref 26.0–34.0)
MCHC: 32 g/dL (ref 30.0–36.0)
MCV: 91.8 fL (ref 80.0–100.0)
Platelets: 338 10*3/uL (ref 150–400)
RBC: 4.53 MIL/uL (ref 4.22–5.81)
RDW: 13.9 % (ref 11.5–15.5)
WBC: 5.7 10*3/uL (ref 4.0–10.5)
nRBC: 0 % (ref 0.0–0.2)

## 2020-02-06 LAB — CBG MONITORING, ED: Glucose-Capillary: 78 mg/dL (ref 70–99)

## 2020-02-06 MED ORDER — LACTATED RINGERS IV BOLUS
1000.0000 mL | Freq: Once | INTRAVENOUS | Status: AC
  Administered 2020-02-06: 1000 mL via INTRAVENOUS

## 2020-02-06 MED ORDER — METOCLOPRAMIDE HCL 5 MG/ML IJ SOLN
10.0000 mg | Freq: Once | INTRAMUSCULAR | Status: AC
  Administered 2020-02-06: 10 mg via INTRAVENOUS
  Filled 2020-02-06: qty 2

## 2020-02-06 MED ORDER — DEXAMETHASONE SODIUM PHOSPHATE 10 MG/ML IJ SOLN
10.0000 mg | Freq: Once | INTRAMUSCULAR | Status: AC
  Administered 2020-02-06: 10 mg via INTRAVENOUS
  Filled 2020-02-06: qty 1

## 2020-02-06 MED ORDER — DIPHENHYDRAMINE HCL 50 MG/ML IJ SOLN
25.0000 mg | Freq: Once | INTRAMUSCULAR | Status: AC
  Administered 2020-02-06: 25 mg via INTRAVENOUS
  Filled 2020-02-06: qty 1

## 2020-02-06 MED ORDER — KETOROLAC TROMETHAMINE 15 MG/ML IJ SOLN
15.0000 mg | Freq: Once | INTRAMUSCULAR | Status: AC
  Administered 2020-02-06: 15 mg via INTRAVENOUS
  Filled 2020-02-06: qty 1

## 2020-02-06 MED ORDER — SODIUM CHLORIDE 0.9% FLUSH
3.0000 mL | Freq: Once | INTRAVENOUS | Status: AC
  Administered 2020-02-06: 3 mL via INTRAVENOUS

## 2020-02-06 NOTE — ED Notes (Signed)
Patient transported to X-ray 

## 2020-02-06 NOTE — ED Provider Notes (Signed)
[facility]ER[facility]ENC[facility] DEPARTMENT Provider Note   CSN: [id number] Arrival date & time: 02/06/20  1045     History Chief Complaint  Patient presents with  . Headache  . Dizziness   Kyle Lucas is a 54 y.o. male.  Patient is a 54 year old male presenting to the ED with chronic headache for years as well as right hand pain from trauma 1 month ago.  Patient states he has a history of headaches, this most recent was lasted for the last 4 days, her friend was concerned about how his head was hurting and told him to the ED for evaluation.  Patient states that the headache is not worst of life and similar to previous but he is finally fed up with the pain I would like evaluation.  Additionally patient states that he had his right hand slammed in a car hood at work and had x-rays approximately 2 weeks ago with no signs of fracture but he still has swelling and pain.  The history is provided by the patient.  Illness Location:  Head Quality:  Ache Severity:  Moderate Onset quality:  Gradual Timing:  Constant Progression:  Unchanged Chronicity:  Chronic Context:  Patient has chronic headache pain Relieved by:  Nothing Worsened by:  Nothing Ineffective treatments:  Over-the-counter medications Associated symptoms: headaches   Associated symptoms: no abdominal pain, no chest pain, no congestion, no cough, no fever, no loss of consciousness, no nausea, no sore throat and no vomiting        Past Medical History:  Diagnosis Date  . Anemia   . Hepatitis   . Hypertension     Patient Active Problem List   Diagnosis Date Noted  . Chest pain 04/24/2013  . Obesity 04/24/2013  . Tobacco abuse 04/24/2013  . Probable Obstructive sleep apnea with fatigue 04/24/2013  . Heat intolerance 04/24/2013  . Hyperglycemia 04/24/2013    Past Surgical History:  Procedure Laterality Date  . APPENDECTOMY    . HERNIA REPAIR    . left  arm surgery     knife injury.         Family History  Problem Relation Age of Onset  . Heart failure Mother   . Cancer Father   . Colon cancer Neg Hx   . Esophageal cancer Neg Hx   . Rectal cancer Neg Hx   . Stomach cancer Neg Hx     Social History   Tobacco Use  . Smoking status: Current Every Day Smoker    Packs/day: 0.50    Years: 20.00    Pack years: 10.00    Types: Cigarettes  . Smokeless tobacco: Never Used  Substance Use Topics  . Alcohol use: Yes    Comment: 1-2 drinks once a week.  . Drug use: No    Home Medications Prior to Admission medications   Medication Sig Start Date End Date Taking? Authorizing Provider  pantoprazole (PROTONIX) 40 MG tablet Take 1 tablet (40 mg total) by mouth 2 (two) times daily. 05/29/19  Yes Cirigliano, [place], DO    Allergies    Patient has no known allergies.  Review of Systems   Review of Systems  Constitutional: Negative for fever.  HENT: Negative for congestion and sore throat.   Respiratory: Negative for cough.   Cardiovascular: Negative for chest pain.  Gastrointestinal: Negative for abdominal pain, nausea and vomiting.  Musculoskeletal:       Right hand swelling and pain  Neurological: Positive for headaches. Negative for  loss of consciousness.  All other systems reviewed and are negative.   Physical Exam Updated Vital Signs BP (!) 147/89 (BP Location: Left Arm)   Pulse 68   Temp 98.3 F (36.8 C) (Oral)   Resp 20   Ht 5\' 6"  (1.676 m)   Wt 108.9 kg   SpO2 98%   BMI 38.74 kg/m   Physical Exam Vitals and nursing note reviewed.  Constitutional:      Appearance: He is well-developed.  HENT:     Head: Normocephalic and atraumatic.  Eyes:     General: No visual field deficit.    Conjunctiva/sclera: Conjunctivae normal.  Cardiovascular:     Rate and Rhythm: Normal rate and regular rhythm.     Heart sounds: No murmur heard.   Pulmonary:     Effort: Pulmonary effort is normal. No respiratory distress.     Breath sounds: Normal breath  sounds.  Abdominal:     Palpations: Abdomen is soft.     Tenderness: There is no abdominal tenderness.  Musculoskeletal:        General: Swelling and tenderness present.     Cervical back: Neck supple.     Comments: Patient has swelling in the right hand mostly over the third and fourth digits, range of motion somewhat decreased but strength intact.  Skin:    General: Skin is warm and dry.  Neurological:     Mental Status: He is alert and oriented to person, place, and time.     GCS: GCS eye subscore is 4. GCS verbal subscore is 5. GCS motor subscore is 6.     Cranial Nerves: No cranial nerve deficit, dysarthria or facial asymmetry.     Sensory: No sensory deficit.     Motor: No weakness.     Coordination: Coordination normal.     Gait: Gait normal.  Psychiatric:        Mood and Affect: Mood normal.        Behavior: Behavior normal.     ED Results / Procedures / Treatments   Labs (all labs ordered are listed, but only abnormal results are displayed) Labs Reviewed  BASIC METABOLIC PANEL - Abnormal; Notable for the following components:      Result Value   Creatinine, Ser 1.66 (*)    GFR calc non Af Amer 46 (*)    GFR calc Af Amer 53 (*)    All other components within normal limits  URINALYSIS, ROUTINE W REFLEX MICROSCOPIC - Abnormal; Notable for the following components:   Color, Urine AMBER (*)    APPearance HAZY (*)    Specific Gravity, Urine 1.032 (*)    Bilirubin Urine SMALL (*)    All other components within normal limits  CBC  CBG MONITORING, ED    EKG None  Radiology CT HEAD WO CONTRAST  Result Date: 02/06/2020 CLINICAL DATA:  Dizziness and headaches for several months EXAM: CT HEAD WITHOUT CONTRAST TECHNIQUE: Contiguous axial images were obtained from the base of the skull through the vertex without intravenous contrast. COMPARISON:  None FINDINGS: Brain: No evidence of acute infarction, hemorrhage, hydrocephalus, extra-axial collection or mass lesion/mass  effect. Vascular: No hyperdense vessel or unexpected calcification. Skull: Normal. Negative for fracture or focal lesion. Sinuses/Orbits: No acute finding. 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 IMPRESSION: 1. No acute intracranial pathology. 2. 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 Correlate with any history of neck pain. 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 If there is a history of pain, recurrent pain dedicated cervical spine imaging may be helpful. Electronically Signed   By: Zetta Bills M.D.   On: 02/06/2020 14:31   DG Hand Complete Right  Result Date: 02/06/2020 CLINICAL DATA:  Trauma 1 month ago painful and swollen EXAM: RIGHT HAND - COMPLETE 3+ VIEW COMPARISON:  None. FINDINGS: There is no evidence of fracture or dislocation. There is no evidence of arthropathy or other focal bone abnormality. Soft tissues are unremarkable. IMPRESSION: Negative. Electronically Signed   By: Donavan Foil M.D.   On: 02/06/2020 16:57    Procedures Procedures (including critical care time)  Medications Ordered in ED Medications  sodium chloride flush (NS) 0.9 % injection 3 mL (3 mLs Intravenous Given 02/06/20 1703)  ketorolac (TORADOL) 15 MG/ML injection 15 mg (15 mg Intravenous Given 02/06/20 1703)  diphenhydrAMINE (BENADRYL) injection 25 mg (25 mg Intravenous Given 02/06/20 1703)  metoCLOPramide (REGLAN) injection 10 mg (10 mg Intravenous Given 02/06/20 1702)  dexamethasone (DECADRON) injection 10 mg (10 mg Intravenous Given 02/06/20 1703)  lactated ringers bolus 1,000 mL (0 mLs Intravenous Stopped 02/06/20 1858)    ED Course  I have reviewed the triage vital signs and the nursing notes.  Pertinent labs & imaging results that were available during my care of the patient were reviewed by me and considered in my medical decision making (see chart for details).    MDM  Rules/Calculators/A&P                          Differential diagnosis: Migraine headache, hand fracture  ED physician interpretation of imaging: CT head without intracranial abnormalities.  Right hand x-ray without fracture ED physician interpretation of labs: Patient's lab work with mildly elevated creatinine near patient's recent baseline.  Otherwise unremarkable.  MDM: Patient is a 54 year old male he was not have a primary care doctor presenting to the ED with chronic headache as well as subacute right hand pain with unremarkable imaging, lab work found to have improved headache after IV headache medications consistent with migraine headache, right hand pain without fracture or dislocation, likely essential hypertension appropriate for outpatient management and follow-up.  Patient's vital signs are stable, patient afebrile.  Patient's physical exam remarkable for right hand pain and swelling.  No focal neurologic deficits.  Patient's work-up unremarkable.  Patient's pain improved with IV headache medications.  Social work contacted to help patient obtain primary care provider.  No further emergency medical interventions indicated at this time.  Diagnosis, treatment and plan of care was discussed and agreed upon with patient.  Patient comfortable with discharge at this time.  Key discharge instructions: You presented to the ED with several years of headache that has been worse over the last week.  Your CT head showed no concerning findings.  Your headache improved with IV medications.  Recommend in the future when you feel a headache coming on take naproxen or Excedrin migraine immediately.  If this does not help he can also follow-up with a primary care provider still above and they may recommend other medications as well as possible consult with neurology.  Your lab work showed a mild elevation in creatinine which is similar to previous but may show some early kidney disease.  You have  hypertension on your vital signs.  Recommend follow with your primary care doctor for further evaluation of hypertension as well as checking her labs again for creatinine level.  Additionally you  had right hand pain after getting her hand closed in a car hood 1 month ago.  No fracture seen on x-ray.  If pain continues you can also talk to your primary care doctor about seeing a specialist for hand therapy.   Final Clinical Impression(s) / ED Diagnoses Final diagnoses:  Hypertension, unspecified type  Chronic nonintractable headache, unspecified headache type  Elevated serum creatinine    Rx / DC Orders ED Discharge Orders    None       Delma Post, MD 02/07/20 1253    Davonna Belling, MD 02/07/20 2012

## 2020-02-06 NOTE — Discharge Instructions (Addendum)
You presented to the ED with several years of headache that has been worse over the last week.  Your CT head showed no concerning findings.  Your headache improved with IV medications.  Recommend in the future when you feel a headache coming on take naproxen or Excedrin migraine immediately.  If this does not help he can also follow-up with a primary care provider still above and they may recommend other medications as well as possible consult with neurology.  Your lab work showed a mild elevation in creatinine which is similar to previous but may show some early kidney disease.  You have hypertension on your vital signs.  Recommend follow with your primary care doctor for further evaluation of hypertension as well as checking her labs again for creatinine level.  Additionally you had right hand pain after getting her hand closed in a car hood 1 month ago.  No fracture seen on x-ray.  If pain continues you can also talk to your primary care doctor about seeing a specialist for hand therapy.

## 2020-02-06 NOTE — Care Management (Signed)
ED CM met with patient to discuss the importance of PCP follow up. Patient is agreeable. CM provided patient with information to the [facility] for follow up and to establish care. Provided patient with verbal and written information provided. Patient verbalized understanding, and provided permission to forward information to the clinic's CM.  Updated EDP on plan. No further ED CM needs identified at this time

## 2020-02-06 NOTE — ED Triage Notes (Addendum)
Pt reports squeezing headache and dizziness x4 days, reports he has been taking excedrin and ibuprofen without relief. Pt a/ox4, speech clear, face symmetrical, moves all limbs equally. Pt also reports stiffness in traps and some pain to the neck. Able to move neck without difficulty. Denies fevers or chills.

## 2020-02-06 NOTE — ED Notes (Signed)
The pt  Reports that he is ready to go home

## 2020-04-02 ENCOUNTER — Emergency Department (HOSPITAL_COMMUNITY)
Admission: EM | Admit: 2020-04-02 | Discharge: 2020-04-02 | Disposition: A | Discharge status: Home / Self Care | Payer: Worker's Compensation | Attending: Emergency Medicine | Admitting: Emergency Medicine

## 2020-04-02 ENCOUNTER — Other Ambulatory Visit: Payer: Self-pay

## 2020-04-02 ENCOUNTER — Emergency Department (HOSPITAL_COMMUNITY): Payer: Worker's Compensation

## 2020-04-02 DIAGNOSIS — Z20822 Contact with and (suspected) exposure to covid-19: Secondary | ICD-10-CM | POA: Diagnosis not present

## 2020-04-02 DIAGNOSIS — Y92810 Car as the place of occurrence of the external cause: Secondary | ICD-10-CM | POA: Diagnosis not present

## 2020-04-02 DIAGNOSIS — W208XXA Other cause of strike by thrown, projected or falling object, initial encounter: Secondary | ICD-10-CM | POA: Diagnosis not present

## 2020-04-02 DIAGNOSIS — I10 Essential (primary) hypertension: Secondary | ICD-10-CM | POA: Diagnosis not present

## 2020-04-02 DIAGNOSIS — S0990XA Unspecified injury of head, initial encounter: Secondary | ICD-10-CM | POA: Diagnosis present

## 2020-04-02 DIAGNOSIS — F17210 Nicotine dependence, cigarettes, uncomplicated: Secondary | ICD-10-CM | POA: Diagnosis not present

## 2020-04-02 DIAGNOSIS — Y998 Other external cause status: Secondary | ICD-10-CM | POA: Insufficient documentation

## 2020-04-02 DIAGNOSIS — Y9389 Activity, other specified: Secondary | ICD-10-CM | POA: Insufficient documentation

## 2020-04-02 LAB — URINALYSIS, ROUTINE W REFLEX MICROSCOPIC
Bacteria, UA: NONE SEEN
Bilirubin Urine: NEGATIVE
Glucose, UA: NEGATIVE mg/dL
Ketones, ur: NEGATIVE mg/dL
Leukocytes,Ua: NEGATIVE
Nitrite: NEGATIVE
Protein, ur: NEGATIVE mg/dL
Specific Gravity, Urine: 1.017 (ref 1.005–1.030)
pH: 6 (ref 5.0–8.0)

## 2020-04-02 LAB — COMPREHENSIVE METABOLIC PANEL
ALT: 17 U/L (ref 0–44)
AST: 20 U/L (ref 15–41)
Albumin: 4 g/dL (ref 3.5–5.0)
Alkaline Phosphatase: 57 U/L (ref 38–126)
Anion gap: 11 (ref 5–15)
BUN: 18 mg/dL (ref 6–20)
CO2: 22 mmol/L (ref 22–32)
Calcium: 9.5 mg/dL (ref 8.9–10.3)
Chloride: 108 mmol/L (ref 98–111)
Creatinine, Ser: 1.51 mg/dL — ABNORMAL HIGH (ref 0.61–1.24)
GFR calc Af Amer: 59 mL/min — ABNORMAL LOW (ref >60)
GFR calc non Af Amer: 51 mL/min — ABNORMAL LOW (ref >60)
Glucose, Bld: 86 mg/dL (ref 70–99)
Potassium: 4.5 mmol/L (ref 3.5–5.1)
Sodium: 141 mmol/L (ref 135–145)
Total Bilirubin: 0.6 mg/dL (ref 0.3–1.2)
Total Protein: 7.4 g/dL (ref 6.5–8.1)

## 2020-04-02 LAB — I-STAT CHEM 8, ED
BUN: 20 mg/dL (ref 6–20)
Calcium, Ion: 1.15 mmol/L (ref 1.15–1.40)
Chloride: 108 mmol/L (ref 98–111)
Creatinine, Ser: 1.4 mg/dL — ABNORMAL HIGH (ref 0.61–1.24)
Glucose, Bld: 85 mg/dL (ref 70–99)
HCT: 41 % (ref 39.0–52.0)
Hemoglobin: 13.9 g/dL (ref 13.0–17.0)
Potassium: 4.6 mmol/L (ref 3.5–5.1)
Sodium: 142 mmol/L (ref 135–145)
TCO2: 23 mmol/L (ref 22–32)

## 2020-04-02 LAB — SAMPLE TO BLOOD BANK

## 2020-04-02 LAB — CBC
HCT: 40.3 % (ref 39.0–52.0)
Hemoglobin: 13 g/dL (ref 13.0–17.0)
MCH: 30.5 pg (ref 26.0–34.0)
MCHC: 32.3 g/dL (ref 30.0–36.0)
MCV: 94.6 fL (ref 80.0–100.0)
Platelets: 322 10*3/uL (ref 150–400)
RBC: 4.26 MIL/uL (ref 4.22–5.81)
RDW: 13.8 % (ref 11.5–15.5)
WBC: 4.3 10*3/uL (ref 4.0–10.5)
nRBC: 0 % (ref 0.0–0.2)

## 2020-04-02 LAB — SARS CORONAVIRUS 2 BY RT PCR (HOSPITAL ORDER, PERFORMED IN ~~LOC~~ HOSPITAL LAB): SARS Coronavirus 2: NEGATIVE

## 2020-04-02 LAB — PROTIME-INR
INR: 1 (ref 0.8–1.2)
Prothrombin Time: 12.8 seconds (ref 11.4–15.2)

## 2020-04-02 LAB — LACTIC ACID, PLASMA: Lactic Acid, Venous: 0.9 mmol/L (ref 0.5–1.9)

## 2020-04-02 MED ORDER — FENTANYL CITRATE (PF) 100 MCG/2ML IJ SOLN
INTRAMUSCULAR | Status: DC | PRN
  Administered 2020-04-02: 50 ug via INTRAVENOUS

## 2020-04-02 MED ORDER — DIPHENHYDRAMINE HCL 50 MG/ML IJ SOLN
25.0000 mg | Freq: Once | INTRAMUSCULAR | Status: AC
  Administered 2020-04-02: 25 mg via INTRAVENOUS
  Filled 2020-04-02: qty 1

## 2020-04-02 MED ORDER — PROCHLORPERAZINE EDISYLATE 10 MG/2ML IJ SOLN
10.0000 mg | Freq: Once | INTRAMUSCULAR | Status: AC
  Administered 2020-04-02: 10 mg via INTRAVENOUS
  Filled 2020-04-02: qty 2

## 2020-04-02 MED ORDER — FENTANYL CITRATE (PF) 100 MCG/2ML IJ SOLN
INTRAMUSCULAR | Status: AC
  Filled 2020-04-02: qty 2

## 2020-04-02 MED ORDER — FENTANYL CITRATE (PF) 100 MCG/2ML IJ SOLN
50.0000 ug | Freq: Once | INTRAMUSCULAR | Status: DC
  Filled 2020-04-02: qty 2

## 2020-04-02 NOTE — Consult Note (Signed)
[facility] Surgery Trauma Consult Note  Kyle Lucas 1966-01-15  [id number].    Requesting MD: Zackowski,MD Chief Complaint/Reason for Consult: head trauma, concussion   HPI:  Kyle Lucas is a 54 y/o M with a PMH obesity, HTN, hernia repair as a child, and left wrist surgery presented to [facility] as a level 2 trauma after the hood of a car fell on his head at work. pateint states he works at [employer] and he was washing off the engine of a car when the hood fell on his head. States he was evaluated by someone at work who was concerned about a head injury so he was transferred to the ED via EMS for further evaluation. Denies LOC, denies falling after the incident. Denies dizziness, visual changes, weakness, neck pain, chest pain, urinary sxs, abdominal pain, or trouble staying awake. Denies daily medication use including blood thinners. States he required hernia surgery when he was around 54 years old and had surgery on some tendons in his left wrist. He does smoke cigarettes but denies illicit drug use.   Afebrile, VSS, CT head/c-spine negative for acute injury. UA negative. CBC WNL, elevated creatinine 1.51 otherwise electrolytes WNL. Lactate WNL.  Per EDP patient was being very repetitive and confused. Trauma being asked to see for possible admission/observation.   ROS: Review of Systems  All other systems reviewed and are negative.   Family History  Problem Relation Age of Onset   Heart failure Mother    Cancer Father    Colon cancer Neg Hx    Esophageal cancer Neg Hx    Rectal cancer Neg Hx    Stomach cancer Neg Hx     Past Medical History:  Diagnosis Date   Anemia    Hepatitis    Hypertension     Past Surgical History:  Procedure Laterality Date   APPENDECTOMY     HERNIA REPAIR     left  arm surgery     knife injury.     Social History:  reports that he has been smoking cigarettes. He has a 10.00 pack-year smoking history. He has  never used smokeless tobacco. He reports current alcohol use. He reports that he does not use drugs.  Allergies: No Known Allergies  (Not in a hospital admission)   Blood pressure (!) 145/105, pulse 84, temperature (!) 97.4 F (36.3 C), temperature source Oral, resp. rate 15, height 5\' 6"  (1.676 m), weight 104.3 kg, SpO2 100 %. Physical Exam: Constitutional: NAD; conversant;  Head: small left frontal contusion  Eyes: Moist conjunctiva; no lid lag; anicteric; PERRL, EOMs in tact  Neck: Trachea midline; no thyromegaly Lungs: Normal respiratory effort; no tactile fremitus CV: RRR; no palpable thrills; no pitting edema GI: Abd soft, obese, non-tender, no palpable hepatosplenomegaly MSK: Normal gait; no clubbing/cyanosis, mild TTP L trapezius muscle.  Psychiatric: Appropriate affect; alert and oriented x3 Lymphatic: No palpable cervical or axillary lymphadenopathy Neuro: no focal deficits, moving all extremities spontaneously. 5/5 strength of BUE and BLE   Results for orders placed or performed during the hospital encounter of 04/02/20 (from the past 48 hour(s))  Comprehensive metabolic panel     Status: Abnormal   Collection Time: 04/02/20 10:28 AM  Result Value Ref Range   Sodium 141 135 - 145 mmol/L   Potassium 4.5 3.5 - 5.1 mmol/L   Chloride 108 98 - 111 mmol/L   CO2 22 22 - 32 mmol/L   Glucose, Bld 86 70 - 99 mg/dL  Comment: Glucose reference range applies only to samples taken after fasting for at least 8 hours.   BUN 18 6 - 20 mg/dL   Creatinine, Ser 1.51 (H) 0.61 - 1.24 mg/dL   Calcium 9.5 8.9 - 10.3 mg/dL   Total Protein 7.4 6.5 - 8.1 g/dL   Albumin 4.0 3.5 - 5.0 g/dL   AST 20 15 - 41 U/L   ALT 17 0 - 44 U/L   Alkaline Phosphatase 57 38 - 126 U/L   Total Bilirubin 0.6 0.3 - 1.2 mg/dL   GFR calc non Af Amer 51 (L) >60 mL/min   GFR calc Af Amer 59 (L) >60 mL/min   Anion gap 11 5 - 15    Comment: Performed at [facility] [address], [place]  [address]  CBC     Status: None   Collection Time: 04/02/20 10:28 AM  Result Value Ref Range   WBC 4.3 4.0 - 10.5 K/uL   RBC 4.26 4.22 - 5.81 MIL/uL   Hemoglobin 13.0 13.0 - 17.0 g/dL   HCT 40.3 39 - 52 %   MCV 94.6 80.0 - 100.0 fL   MCH 30.5 26.0 - 34.0 pg   MCHC 32.3 30.0 - 36.0 g/dL   RDW 13.8 11.5 - 15.5 %   Platelets 322 150 - 400 K/uL   nRBC 0.0 0.0 - 0.2 %    Comment: Performed at [facility], [place] [address] [place], [place] [address]  Lactic acid, plasma     Status: None   Collection Time: 04/02/20 10:28 AM  Result Value Ref Range   Lactic Acid, Venous 0.9 0.5 - 1.9 mmol/L    Comment: Performed at [facility] [address], [place], [place] [address]  Protime-INR     Status: None   Collection Time: 04/02/20 10:28 AM  Result Value Ref Range   Prothrombin Time 12.8 11.4 - 15.2 seconds   INR 1.0 0.8 - 1.2    Comment: (NOTE) INR goal varies based on device and disease states. Performed at [facility], [place] [address] [place], [place] [address]   Sample to Blood Bank     Status: None   Collection Time: 04/02/20 10:28 AM  Result Value Ref Range   Blood Bank Specimen SAMPLE AVAILABLE FOR TESTING    Sample Expiration      04/03/2020,2359 Performed at [facility], [place] [address], [place], [place] [address]   I-Stat Chem 8, ED     Status: Abnormal   Collection Time: 04/02/20 10:37 AM  Result Value Ref Range   Sodium 142 135 - 145 mmol/L   Potassium 4.6 3.5 - 5.1 mmol/L   Chloride 108 98 - 111 mmol/L   BUN 20 6 - 20 mg/dL   Creatinine, Ser 1.40 (H) 0.61 - 1.24 mg/dL   Glucose, Bld 85 70 - 99 mg/dL    Comment: Glucose reference range applies only to samples taken after fasting for at least 8 hours.   Calcium, Ion 1.15 1.15 - 1.40 mmol/L   TCO2 23 22 - 32 mmol/L   Hemoglobin 13.9 13.0 - 17.0 g/dL   HCT 41.0 39 - 52 %  Urinalysis, Routine w reflex microscopic Urine, Clean Catch     Status: Abnormal   Collection Time: 04/02/20 10:39 AM  Result  Value Ref Range   Color, Urine YELLOW YELLOW   APPearance CLEAR CLEAR   Specific Gravity, Urine 1.017 1.005 -  1.030   pH 6.0 5.0 - 8.0   Glucose, UA NEGATIVE NEGATIVE mg/dL   Hgb urine dipstick SMALL (A) NEGATIVE   Bilirubin Urine NEGATIVE NEGATIVE   Ketones, ur NEGATIVE NEGATIVE mg/dL   Protein, ur NEGATIVE NEGATIVE mg/dL   Nitrite NEGATIVE NEGATIVE   Leukocytes,Ua NEGATIVE NEGATIVE   RBC / HPF 0-5 0 - 5 RBC/hpf   WBC, UA 0-5 0 - 5 WBC/hpf   Bacteria, UA NONE SEEN NONE SEEN   Squamous Epithelial / LPF 0-5 0 - 5   Mucus PRESENT     Comment: Performed at [facility], [place] [address], [place], [place] [address]   CT Head Wo Contrast  Result Date: 04/02/2020 CLINICAL DATA:  Dangerous injury mechanism with trauma to the neck. EXAM: CT HEAD WITHOUT CONTRAST CT CERVICAL SPINE WITHOUT CONTRAST TECHNIQUE: Multidetector CT imaging of the head and cervical spine was performed following the standard protocol without intravenous contrast. Multiplanar CT image reconstructions of the cervical spine were also generated. COMPARISON:  February 06, 2020 FINDINGS: CT HEAD FINDINGS Brain: No evidence of acute infarction, hemorrhage, hydrocephalus, extra-axial collection or mass lesion/mass effect. Vascular: No hyperdense vessel or unexpected calcification. Skull: Normal. Negative for fracture or focal lesion. Sinuses/Orbits: Mild RIGHT ethmoid sinus disease. Orbits are unremarkable. Other: None. CT CERVICAL SPINE FINDINGS Alignment: Normal. Skull base and vertebrae: No acute fracture. No primary bone lesion or focal pathologic process. Soft tissues and spinal canal: No prevertebral fluid or swelling. No visible canal hematoma. Disc levels: Multilevel degenerative changes in the spine. Greatest at C5-6 and C6-7. Upper chest: Negative. Other: Calcification in the nuchal ligament a chronic finding based on appearance. IMPRESSION: 1. No acute intracranial abnormality. 2. No acute fracture or subluxation of the  cervical spine. 3. Multilevel degenerative changes of the spine. Electronically Signed   By: Zetta Bills M.D.   On: 04/02/2020 11:29   CT Cervical Spine Wo Contrast  Result Date: 04/02/2020 CLINICAL DATA:  Dangerous injury mechanism with trauma to the neck. EXAM: CT HEAD WITHOUT CONTRAST CT CERVICAL SPINE WITHOUT CONTRAST TECHNIQUE: Multidetector CT imaging of the head and cervical spine was performed following the standard protocol without intravenous contrast. Multiplanar CT image reconstructions of the cervical spine were also generated. COMPARISON:  February 06, 2020 FINDINGS: CT HEAD FINDINGS Brain: No evidence of acute infarction, hemorrhage, hydrocephalus, extra-axial collection or mass lesion/mass effect. Vascular: No hyperdense vessel or unexpected calcification. Skull: Normal. Negative for fracture or focal lesion. Sinuses/Orbits: Mild RIGHT ethmoid sinus disease. Orbits are unremarkable. Other: None. CT CERVICAL SPINE FINDINGS Alignment: Normal. Skull base and vertebrae: No acute fracture. No primary bone lesion or focal pathologic process. Soft tissues and spinal canal: No prevertebral fluid or swelling. No visible canal hematoma. Disc levels: Multilevel degenerative changes in the spine. Greatest at C5-6 and C6-7. Upper chest: Negative. Other: Calcification in the nuchal ligament a chronic finding based on appearance. IMPRESSION: 1. No acute intracranial abnormality. 2. No acute fracture or subluxation of the cervical spine. 3. Multilevel degenerative changes of the spine. Electronically Signed   By: Zetta Bills M.D.   On: 04/02/2020 11:29    Assessment/Plan 54 y/o M who was at work when the hood of a car fell on him. Initially presented with headache, confusion, repetitive speech, and mild concussive sxs. CT head was negative. During my exam patient was no longer confused, he was not repetitive or somnolent. I do not think he warrants admission for further observation. I recommend discharge  home with concussion  information and return precautions.   Jill Alexanders, PA-C [facility] Please see Amion for pager number during day hours 7:00am-4:30pm 04/02/2020, 3:17 PM
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
## 2020-04-02 NOTE — ED Notes (Signed)
This RN returned to ED from a floor transport to find pt was not in his bed. Per nurse tech pt left before discharge was provided.

## 2020-04-02 NOTE — ED Notes (Signed)
Pt transported back from CT.  

## 2020-04-02 NOTE — ED Notes (Signed)
Attempted to contact pt. Phone was answered with no communication form pt. Phone then hung up. Attempted to call a second time. Phone sent to voicemail. Left voicemail stating "Hi, this is your nurse please give me a call back".
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
## 2020-04-02 NOTE — Discharge Instructions (Signed)
Alternate 600 mg of ibuprofen and [phone] mg of Tylenol every 3-6 hours as needed for pain. Do not exceed 4000 mg of Tylenol daily.  Take ibuprofen with food to avoid upset stomach issues.  Apply ice or heat, whichever feels best for comfort.  I usually recommend 20 minutes at a time 2-3 times daily.  Take some hot showers and hot baths throughout the day and do some gentle stretching to avoid muscle stiffness.   You may have a concussion, which is head injury without evidence of abnormality on CT scan.  Stay in a dimly lit room with minimal stimuli, reduce screen time (phone, TV) by at least 50% if not more, and avoid contact sports until cleared by a physician.  I have given you the contact information for Dr. Hulan Saas with [facility] who runs a concussion clinic.  You can follow-up with him for re-evaluation of symptoms.   It is very important to avoid doing anything that could reinjure your brain or worsen your concussion.  Avoid contact sports or strenuous activities that could result in head injury.  Return to the emergency department immediately for any concerning signs or symptoms develop such as altered mental status, persistent vomiting, weakness, severe swelling or redness of a limb, or fevers.

## 2020-12-16 ENCOUNTER — Emergency Department (HOSPITAL_COMMUNITY)
Admission: EM | Admit: 2020-12-16 | Discharge: 2020-12-16 | Disposition: A | Discharge status: Home / Self Care | Payer: BC Managed Care – PPO | Attending: Emergency Medicine | Admitting: Emergency Medicine

## 2020-12-16 ENCOUNTER — Encounter (HOSPITAL_COMMUNITY): Payer: Self-pay

## 2020-12-16 ENCOUNTER — Other Ambulatory Visit: Payer: Self-pay

## 2020-12-16 DIAGNOSIS — I10 Essential (primary) hypertension: Secondary | ICD-10-CM | POA: Diagnosis not present

## 2020-12-16 DIAGNOSIS — F17210 Nicotine dependence, cigarettes, uncomplicated: Secondary | ICD-10-CM | POA: Insufficient documentation

## 2020-12-16 DIAGNOSIS — K4090 Unilateral inguinal hernia, without obstruction or gangrene, not specified as recurrent: Secondary | ICD-10-CM | POA: Diagnosis not present

## 2020-12-16 DIAGNOSIS — R1032 Left lower quadrant pain: Secondary | ICD-10-CM

## 2020-12-16 MED ORDER — METAMUCIL 28 % PO PACK: 1.0000 | PACK | Freq: Two times a day (BID) | ORAL | 0 refills | Status: AC

## 2020-12-16 MED ORDER — HYDROMORPHONE HCL 1 MG/ML IJ SOLN
1.0000 mg | Freq: Once | INTRAMUSCULAR | Status: AC
  Administered 2020-12-16: 1 mg via INTRAVENOUS
  Filled 2020-12-16: qty 1

## 2020-12-16 MED ORDER — METHOCARBAMOL 500 MG PO TABS: 500.0000 mg | ORAL_TABLET | Freq: Three times a day (TID) | ORAL | 0 refills | Status: AC | PRN

## 2020-12-16 NOTE — ED Provider Notes (Signed)
[facility] EMERGENCY DEPARTMENT Provider Note   CSN: [id number] Arrival date & time: 12/16/20  6767     History Chief Complaint  Patient presents with  . Inguinal Hernia    Kyle Lucas Favorite is a 55 y.o. male with a hx of hypertension, tobacco abuse, & prior right inguinal hernia repair who presents to the ED with complaints of left groin pain that began acutely yesterday. Patient states he was doing some heavy lifting at his job yesterday when he had acute onset L groin pain. Pain is constant, worse with movement & position changes, no alleviating factors. He feels as though he needs to have a bowel movement but knows he does not need to. He is passing gas and is not vomiting. States this feels the same as when he had a right inguinal hernia which required repair in the 45s.. Denies fever, chills, nausea, vomiting, diarrhea, dysuria, or difficulty urinating.   HPI     Past Medical History:  Diagnosis Date  . Anemia   . Hepatitis   . Hypertension     Patient Active Problem List   Diagnosis Date Noted  . Chest pain 04/24/2013  . Obesity 04/24/2013  . Tobacco abuse 04/24/2013  . Probable Obstructive sleep apnea with fatigue 04/24/2013  . Heat intolerance 04/24/2013  . Hyperglycemia 04/24/2013    Past Surgical History:  Procedure Laterality Date  . APPENDECTOMY    . HERNIA REPAIR    . left  arm surgery     knife injury.        Family History  Problem Relation Age of Onset  . Heart failure Mother   . Cancer Father   . Colon cancer Neg Hx   . Esophageal cancer Neg Hx   . Rectal cancer Neg Hx   . Stomach cancer Neg Hx     Social History   Tobacco Use  . Smoking status: Current Every Day Smoker    Packs/day: 0.50    Years: 20.00    Pack years: 10.00    Types: Cigarettes  . Smokeless tobacco: Never Used  Substance Use Topics  . Alcohol use: Yes    Comment: 1-2 drinks once a week.  . Drug use: No    Home Medications Prior to Admission  medications   Medication Sig Start Date End Date Taking? Authorizing Provider  pantoprazole (PROTONIX) 40 MG tablet Take 1 tablet (40 mg total) by mouth 2 (two) times daily. Patient not taking: Reported on 04/02/2020 05/29/19   Lavena Bullion, DO    Allergies    Patient has no known allergies.  Review of Systems   Review of Systems  Constitutional: Negative for chills and fever.  Respiratory: Negative for shortness of breath.   Cardiovascular: Negative for chest pain.  Gastrointestinal: Negative for abdominal pain, diarrhea, nausea and vomiting.  Genitourinary: Negative for dysuria.       Positive for groin pain.   All other systems reviewed and are negative.   Physical Exam Updated Vital Signs BP (!) 150/100 (BP Location: Right Arm)   Pulse 86   Temp 98.3 F (36.8 C) (Oral)   Resp 16   Ht 5\' 6"  (1.676 m)   Wt 104 kg   SpO2 100%   BMI 37.01 kg/m   Physical Exam Vitals and nursing note reviewed.  Constitutional:      General: He is not in acute distress.    Appearance: He is well-developed. He is not toxic-appearing.  HENT:  Head: Normocephalic and atraumatic.  Eyes:     General:        Right eye: No discharge.        Left eye: No discharge.     Conjunctiva/sclera: Conjunctivae normal.  Cardiovascular:     Rate and Rhythm: Normal rate and regular rhythm.  Pulmonary:     Effort: Pulmonary effort is normal. No respiratory distress.     Breath sounds: Normal breath sounds. No wheezing, rhonchi or rales.  Abdominal:     General: Bowel sounds are normal. There is no distension.     Palpations: Abdomen is soft.     Tenderness: There is abdominal tenderness (mild left suprapubic). There is no guarding or rebound.  Genitourinary:    Penis: No erythema, discharge, swelling or lesions.      Comments: RN Sophie present as chaperone.  No erythema or increased warmth. Left inguinal canal with very small palpable area suspicion for hernia, but no irreducible hernia  structures, no findings of incarceration. No significant testicular swelling/tenderness. No obvious torsion noted.   Musculoskeletal:     Cervical back: Neck supple.  Skin:    General: Skin is warm and dry.     Findings: No rash.  Neurological:     Mental Status: He is alert.     Comments: Clear speech.   Psychiatric:        Behavior: Behavior normal.     ED Results / Procedures / Treatments   Labs (all labs ordered are listed, but only abnormal results are displayed) Labs Reviewed - No data to display  EKG None  Radiology No results found.  Procedures Procedures   Medications Ordered in ED Medications - No data to display  ED Course  I have reviewed the triage vital signs and the nursing notes.  Pertinent labs & imaging results that were available during my care of the patient were reviewed by me and considered in my medical decision making (see chart for details).    MDM Rules/Calculators/A&P                         Patient presents to the ED with complaints of left groin pain S/p heavy lifting yesterday. Nontoxic, BP elevated- doubt HTN emergency.   Additional history obtained:  Additional history obtained from chart review & nursing note review.   ED Course:  On exam small defect at left inguinal canal suspicious for hernia, no irreducible hernia present, no findings to suggest incarceration. Patient is not having any urinary sxs or difficulty urinating. He is not vomiting, continues to pass gas, and abdomen is soft and only mildly tender in the left suprapubic area otherwise nontender- do not suspect obstruction or diverticulitis at this time. Will treat supportively with general surgery follow up. We discussed straining precautions, rest, and ice. Will give metamucil to avoid straining with BM and muscle relaxant, NSAIDs avoided as patient has had prior elevated creatinines. We discussed treatment plan, need for follow-up, and return precautions with the patient.  Provided opportunity for questions, patient confirmed understanding and is in agreement with plan.   This is a shared visit with supervising physician Dr. Jeanell Sparrow who has independently evaluated patient & provided guidance in evaluation/management/disposition, in agreement with care   Portions of this note were generated with Dragon dictation software. Dictation errors may occur despite best attempts at proofreading.  Final Clinical Impression(s) / ED Diagnoses Final diagnoses:  Left groin pain    Rx /  DC Orders ED Discharge Orders         Ordered    psyllium (METAMUCIL) 28 % packet  2 times daily        12/16/20 0755    methocarbamol (ROBAXIN) 500 MG tablet  Every 8 hours PRN        12/16/20 0755           Markeith Jue, Glynda Jaeger, PA-C 12/16/20 0802    Pattricia Boss, MD 12/18/20 1719

## 2020-12-16 NOTE — Discharge Instructions (Addendum)
You were seen in the ER today for groin pain.  We suspect that you have a left inguinal/groin hernia.  Please see attached handout.  Please rest and try to lay down over the next few days. Apply ice to this area wrapped in a towel 20 minutes on 40 minutes off over the next 72 hours.  Please avoid straining- no heavy lifting, bearing down/straining for a bowel movement, straining with coughing, or closing your mouth with sneezing for example.   We are sending you home with metamucil to take twice per day to avoid straining with bowel movements as well as robaxin to take every 8 hours as needed for pain. Robaxin is a muscle relaxant that can make you sleepy do not drive or operate heavy machinery when taking this medication. Do not drink alcohol or take other sedating medications.   We have prescribed you new medication(s) today. Discuss the medications prescribed today with your pharmacist as they can have adverse effects and interactions with your other medicines including over the counter and prescribed medications. Seek medical evaluation if you start to experience new or abnormal symptoms after taking one of these medicines, seek care immediately if you start to experience difficulty breathing, feeling of your throat closing, facial swelling, or rash as these could be indications of a more serious allergic reaction  After the next few days please continue to try to avoid heavy lifting   Please follow up with general surgery, call today to make an appointment as soon as possible. Return to the Er for new or worsening symptoms including but not limited to new or worsening pain, inability to keep fluids down, vomiting, inability to have a bowel movement, redness/swelling to the area of pain, fever, difficulty urinating or any other concerns.

## 2020-12-16 NOTE — ED Notes (Signed)
ED Provider at bedside. 

## 2020-12-16 NOTE — ED Triage Notes (Signed)
Heavy lifting at this job yesterday and had developed a left inguinal hernia. Hx of same on the right.   Denies any nausea, vomiting and dysuria.

## 2021-10-13 IMAGING — CT CT HEAD W/O CM
4 series · 16 of 47 positions shown, 18 images · non-contrast
Comparison: None

CLINICAL DATA: Dizziness and headaches for several months

EXAM:
CT HEAD WITHOUT CONTRAST
TECHNIQUE: Contiguous axial images were obtained from the base of the skull
through the vertex without intravenous contrast.

[Series 3: head without · axial · non-contrast · 0.44mm/px · z∈[-227,-107]mm · 7 of 33 slices shown, 9 images]
[im 5/33  brain]
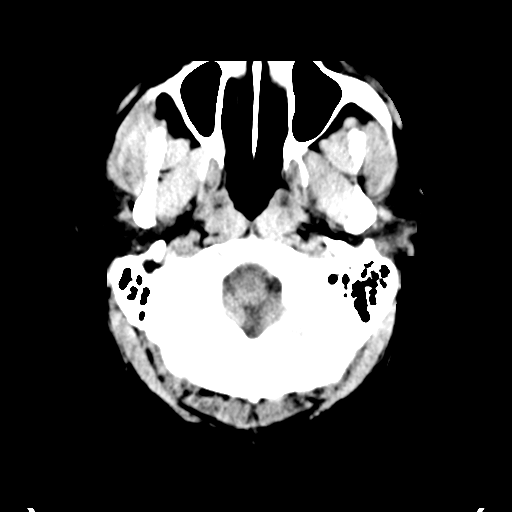
[im 5/33  bone]
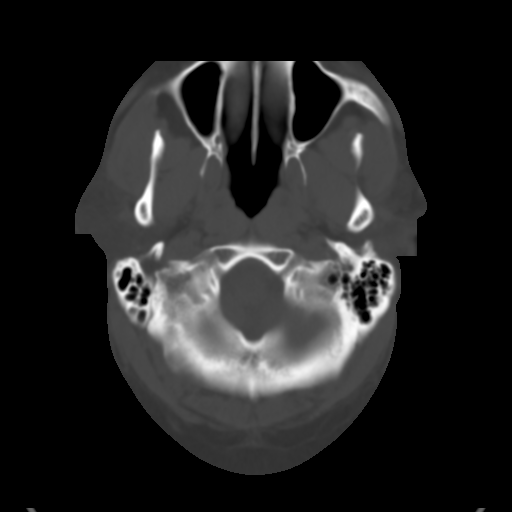
[im 9/33  brain]
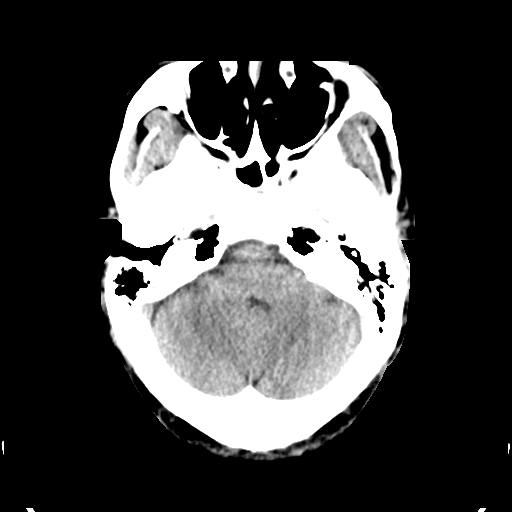
[im 13/33  brain]
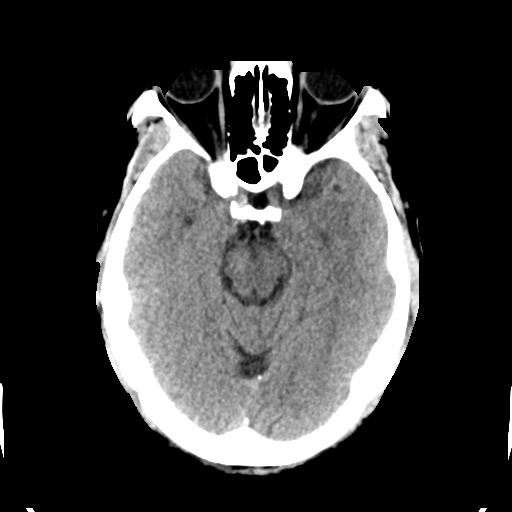
[im 17/33  brain]
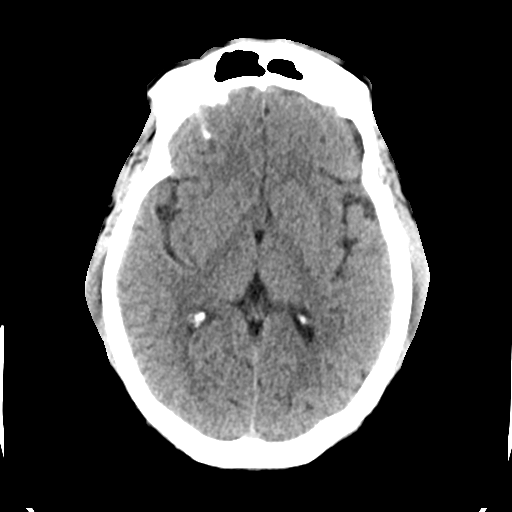
[im 21/33  brain]
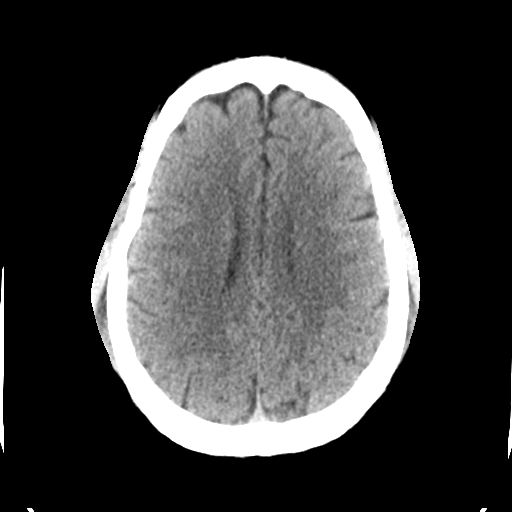
[im 21/33  bone]
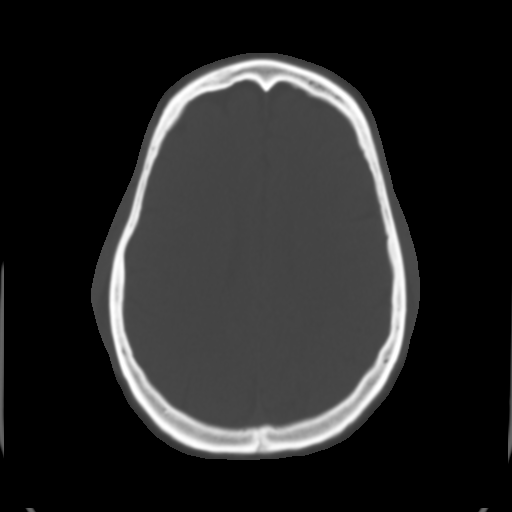
[im 25/33  brain]
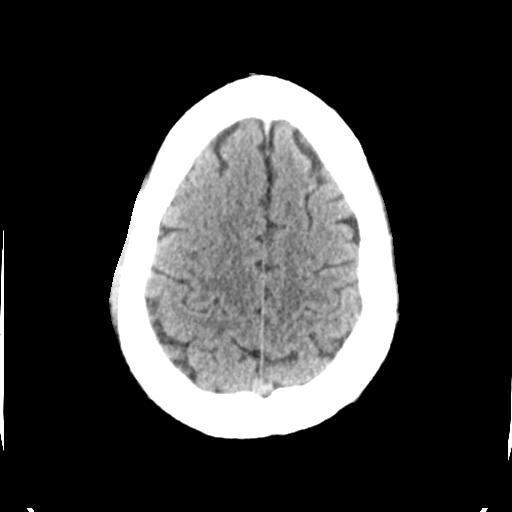
[im 29/33  brain]
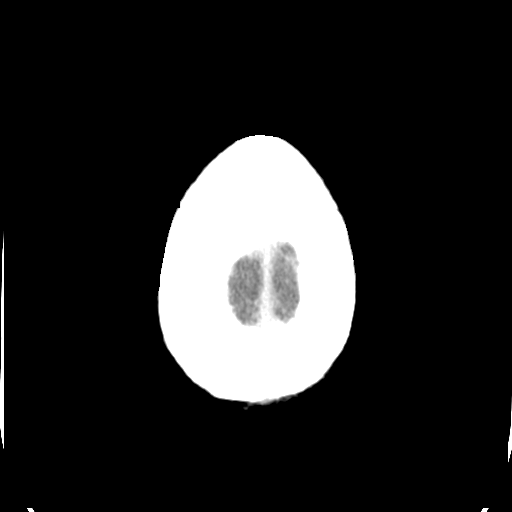

[Series 4: head bone · axial · 0.44mm/px · z∈[-231,-199]mm · 3 of 83 slices shown]
[im 9/83  bone]
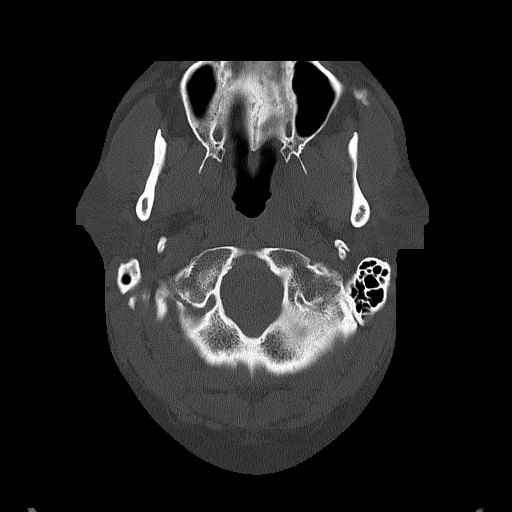
[im 17/83  bone]
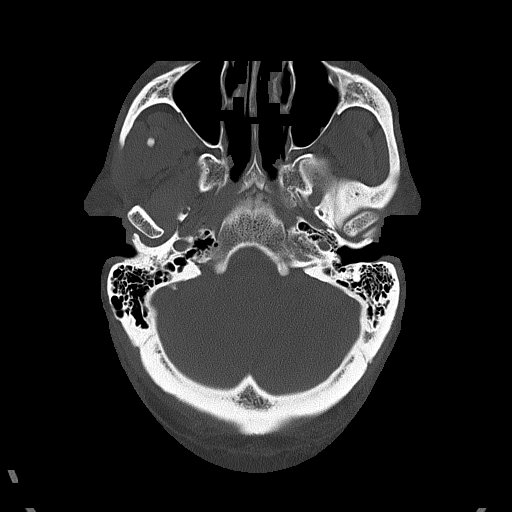
[im 25/83  bone]
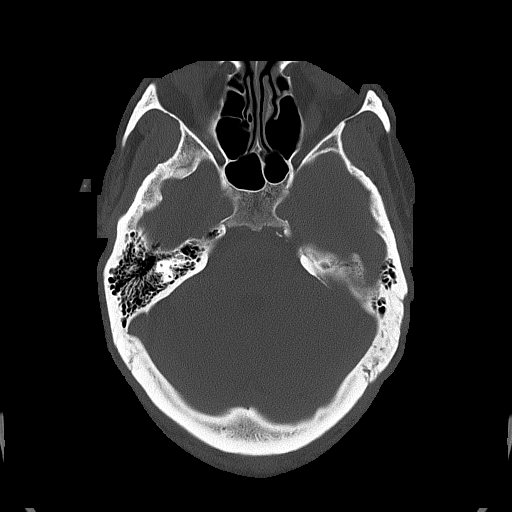

[Series 5: head without cor · coronal · non-contrast · 0.34mm/px · 3 of 69 slices shown]
[im 23/69  brain]
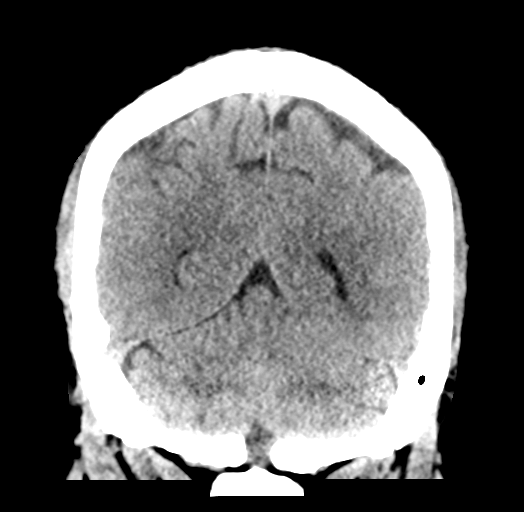
[im 31/69  brain]
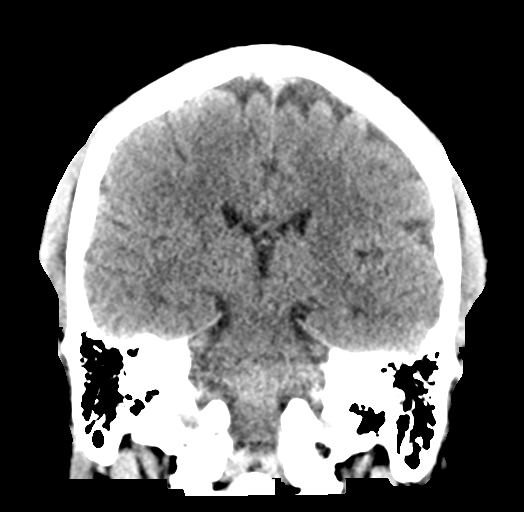
[im 38/69  brain]
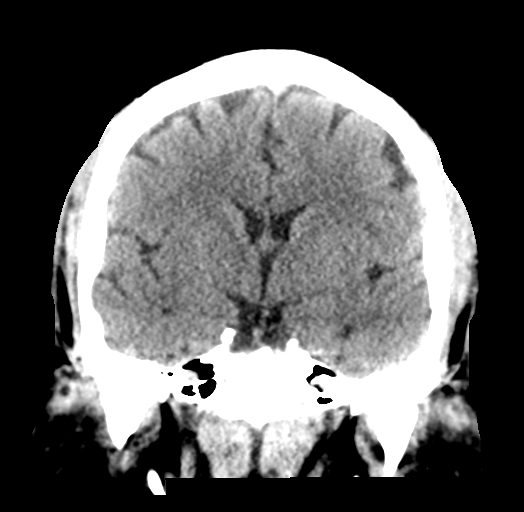

[Series 6: head without sag · sagittal · non-contrast · 0.32mm/px · 3 of 61 slices shown]
[im 21/61  brain]
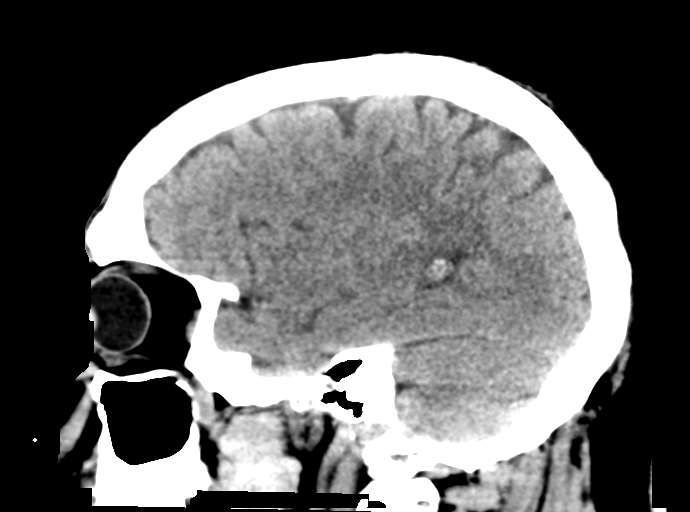
[im 31/61  brain]
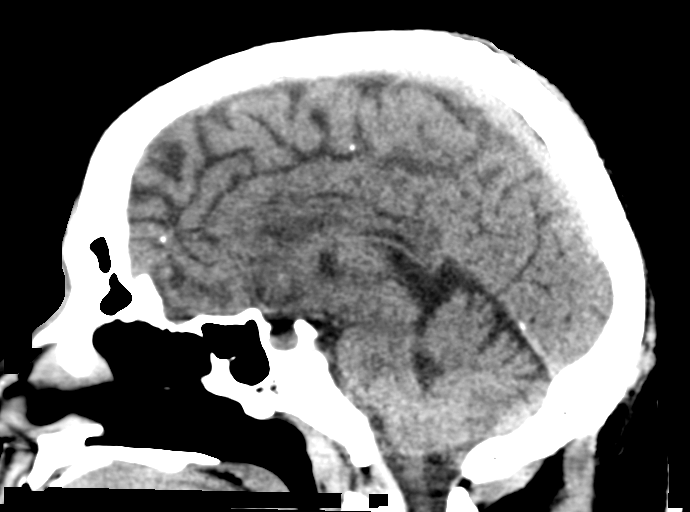
[im 41/61  brain]
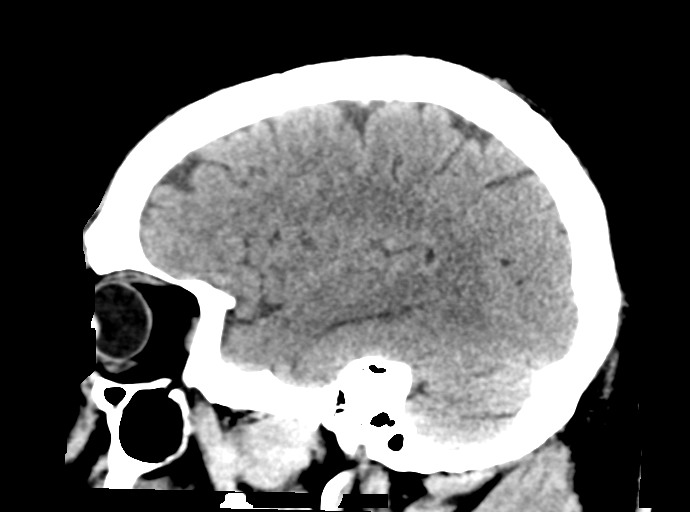

[16 of 47 positions shown; findings below may reference images not displayed]

FINDINGS: Brain: No evidence of acute infarction, hemorrhage, hydrocephalus,
extra-axial collection or mass lesion/mass effect.

Vascular: No hyperdense vessel or unexpected calcification.

Skull: Normal. Negative for fracture or focal lesion.

Sinuses/Orbits: No acute finding.

Other: Hypoplastic appearing odontoid process with degenerative
changes.
IMPRESSION: 1. No acute intracranial pathology.
2. Hypoplastic appearing odontoid process with degenerative changes.
Correlate with any history of neck pain. This appears to represent a
congenital variant of the odontoid, this areas incompletely imaged.
If there is a history of pain, recurrent pain dedicated cervical
spine imaging may be helpful.
